# Patient Record
Sex: Male | Born: 1940 | Race: White | Hispanic: No | Smoking: Former smoker
Health system: Southern US, Community
[De-identification: ages and names within clinical notes are randomized; demographics above are authoritative.]

## PROBLEM LIST (undated history)

## (undated) DIAGNOSIS — K5100 Ulcerative (chronic) pancolitis without complications: Secondary | ICD-10-CM

## (undated) DIAGNOSIS — E7800 Pure hypercholesterolemia, unspecified: Secondary | ICD-10-CM

## (undated) DIAGNOSIS — I480 Paroxysmal atrial fibrillation: Secondary | ICD-10-CM

## (undated) DIAGNOSIS — I2510 Atherosclerotic heart disease of native coronary artery without angina pectoris: Secondary | ICD-10-CM

## (undated) DIAGNOSIS — M1990 Unspecified osteoarthritis, unspecified site: Secondary | ICD-10-CM

## (undated) DIAGNOSIS — I10 Essential (primary) hypertension: Secondary | ICD-10-CM

## (undated) DIAGNOSIS — K219 Gastro-esophageal reflux disease without esophagitis: Secondary | ICD-10-CM

## (undated) HISTORY — PX: CARDIAC CATHETERIZATION: SHX172

## (undated) HISTORY — DX: Pure hypercholesterolemia, unspecified: E78.00

## (undated) HISTORY — DX: Unspecified osteoarthritis, unspecified site: M19.90

## (undated) HISTORY — PX: ROTATOR CUFF REPAIR: SHX139

## (undated) HISTORY — DX: Ulcerative (chronic) pancolitis without complications: K51.00

## (undated) HISTORY — DX: Atherosclerotic heart disease of native coronary artery without angina pectoris: I25.10

## (undated) HISTORY — PX: OTHER SURGICAL HISTORY: SHX169

## (undated) HISTORY — DX: Paroxysmal atrial fibrillation: I48.0

## (undated) HISTORY — DX: Essential (primary) hypertension: I10

## (undated) HISTORY — DX: Gastro-esophageal reflux disease without esophagitis: K21.9

---

## 1998-06-04 ENCOUNTER — Emergency Department (HOSPITAL_COMMUNITY): Admission: EM | Admit: 1998-06-04 | Discharge: 1998-06-04 | Payer: Self-pay | Admitting: Emergency Medicine

## 1998-08-31 ENCOUNTER — Ambulatory Visit (HOSPITAL_COMMUNITY): Admission: RE | Admit: 1998-08-31 | Discharge: 1998-08-31 | Payer: Self-pay | Admitting: Gastroenterology

## 2000-09-12 ENCOUNTER — Encounter (INDEPENDENT_AMBULATORY_CARE_PROVIDER_SITE_OTHER): Payer: Self-pay | Admitting: Specialist

## 2000-09-12 ENCOUNTER — Ambulatory Visit (HOSPITAL_COMMUNITY): Admission: RE | Admit: 2000-09-12 | Discharge: 2000-09-12 | Payer: Self-pay | Admitting: Gastroenterology

## 2002-08-07 ENCOUNTER — Emergency Department (HOSPITAL_COMMUNITY): Admission: EM | Admit: 2002-08-07 | Discharge: 2002-08-07 | Payer: Self-pay | Admitting: Emergency Medicine

## 2003-03-05 ENCOUNTER — Ambulatory Visit (HOSPITAL_COMMUNITY): Admission: RE | Admit: 2003-03-05 | Discharge: 2003-03-05 | Payer: Self-pay | Admitting: Internal Medicine

## 2003-03-05 ENCOUNTER — Encounter: Payer: Self-pay | Admitting: Internal Medicine

## 2003-11-11 ENCOUNTER — Ambulatory Visit (HOSPITAL_COMMUNITY): Admission: RE | Admit: 2003-11-11 | Discharge: 2003-11-11 | Payer: Self-pay | Admitting: Gastroenterology

## 2003-11-11 ENCOUNTER — Encounter (INDEPENDENT_AMBULATORY_CARE_PROVIDER_SITE_OTHER): Payer: Self-pay | Admitting: Specialist

## 2004-11-09 ENCOUNTER — Ambulatory Visit (HOSPITAL_COMMUNITY): Admission: RE | Admit: 2004-11-09 | Discharge: 2004-11-10 | Payer: Self-pay | Admitting: Cardiology

## 2005-08-17 ENCOUNTER — Encounter: Admission: RE | Admit: 2005-08-17 | Discharge: 2005-08-17 | Payer: Self-pay | Admitting: Gastroenterology

## 2005-08-31 ENCOUNTER — Encounter (INDEPENDENT_AMBULATORY_CARE_PROVIDER_SITE_OTHER): Payer: Self-pay | Admitting: Specialist

## 2005-08-31 ENCOUNTER — Ambulatory Visit (HOSPITAL_COMMUNITY): Admission: RE | Admit: 2005-08-31 | Discharge: 2005-08-31 | Payer: Self-pay | Admitting: General Surgery

## 2005-12-11 ENCOUNTER — Encounter: Admission: RE | Admit: 2005-12-11 | Discharge: 2005-12-11 | Payer: Self-pay | Admitting: Gastroenterology

## 2006-01-11 ENCOUNTER — Encounter: Admission: RE | Admit: 2006-01-11 | Discharge: 2006-01-11 | Payer: Self-pay | Admitting: Gastroenterology

## 2006-03-13 ENCOUNTER — Encounter: Admission: RE | Admit: 2006-03-13 | Discharge: 2006-03-13 | Payer: Self-pay | Admitting: Gastroenterology

## 2006-10-28 ENCOUNTER — Observation Stay (HOSPITAL_COMMUNITY): Admission: EM | Admit: 2006-10-28 | Discharge: 2006-10-29 | Payer: Self-pay | Admitting: Emergency Medicine

## 2006-10-28 ENCOUNTER — Ambulatory Visit: Payer: Self-pay | Admitting: Cardiology

## 2006-10-28 ENCOUNTER — Encounter: Payer: Self-pay | Admitting: Cardiology

## 2007-04-18 IMAGING — CT CT ABDOMEN W/ CM
2 of 5 series · 15 of 42 positions shown, 19 images · IV contrast (READICAT/WATER & [ID] OMNI 300)
Comparison: No prior CT.

CLINICAL DATA: Mid abdominal pain and distention. History of ulcerative
colitis.

CT ABDOMEN AND PELVIS WITH CONTRAST 03/13/2006:
TECHNIQUE: Multidetector helical CT of the abdomen and pelvis was performed
during bolus administration of intravenous contrast. Oral contrast was given.
Delayed imaging through the kidneys was performed.
Contrast:  125 cc Omnipaque 300.

[Series 102: a&p w/ · axial · 0.66mm/px · z∈[-400,-39]mm · 12 of 414 slices shown, 16 images]
[im 35/414  soft-tissue]
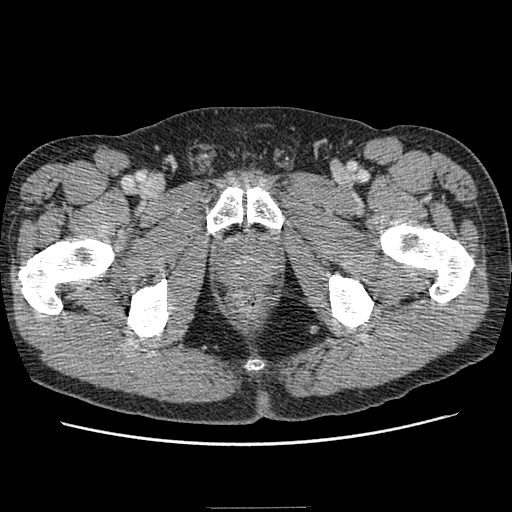
[im 35/414  bone]
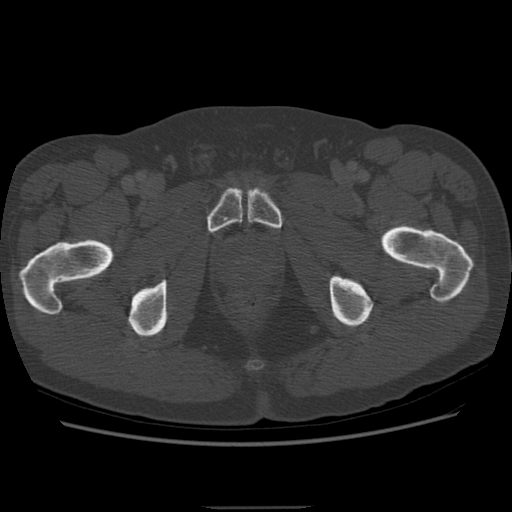
[im 69/414  soft-tissue]
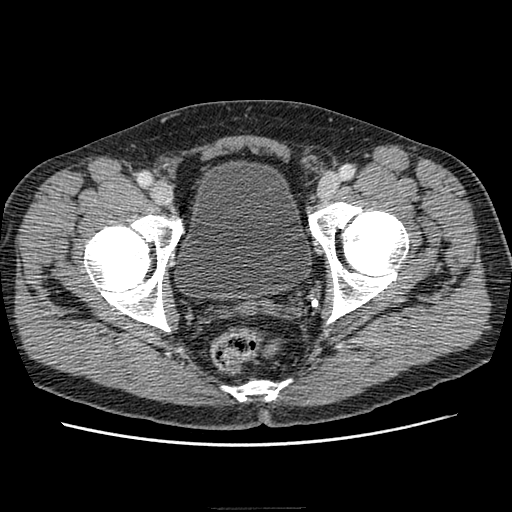
[im 104/414  soft-tissue]
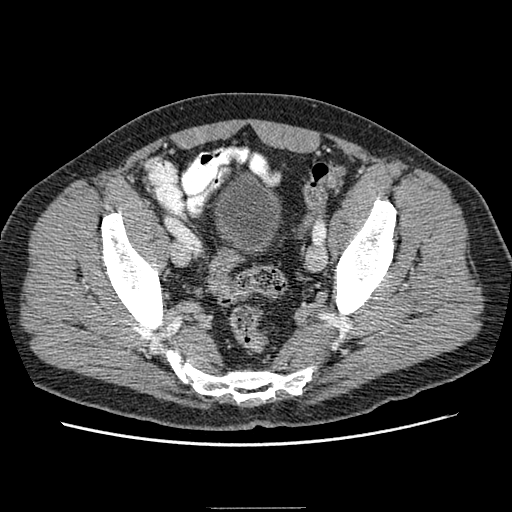
[im 155/414  soft-tissue]
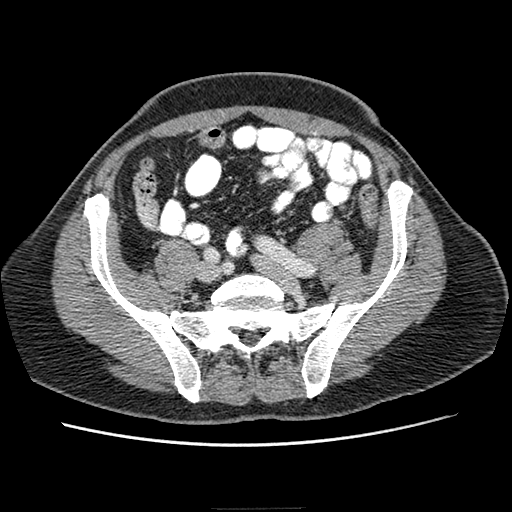
[im 190/414  soft-tissue]
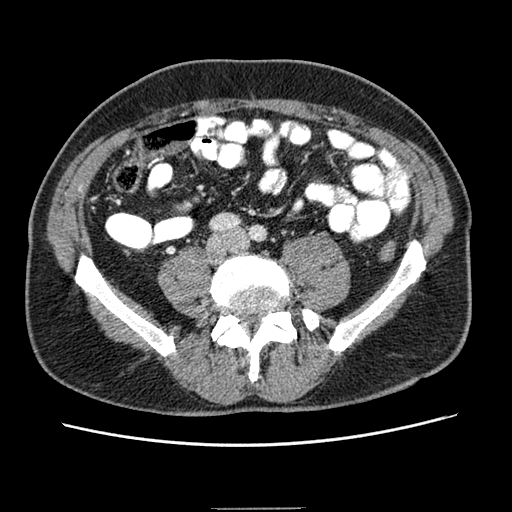
[im 224/414  soft-tissue]
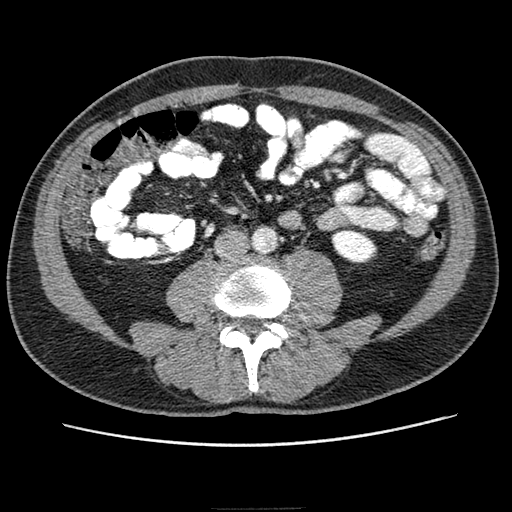
[im 259/414  soft-tissue]
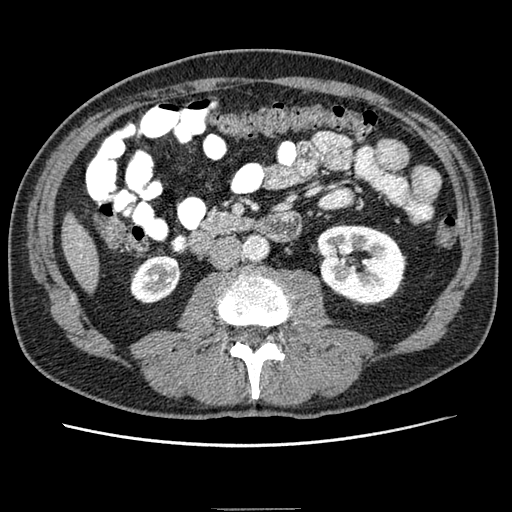
[im 310/414  soft-tissue]
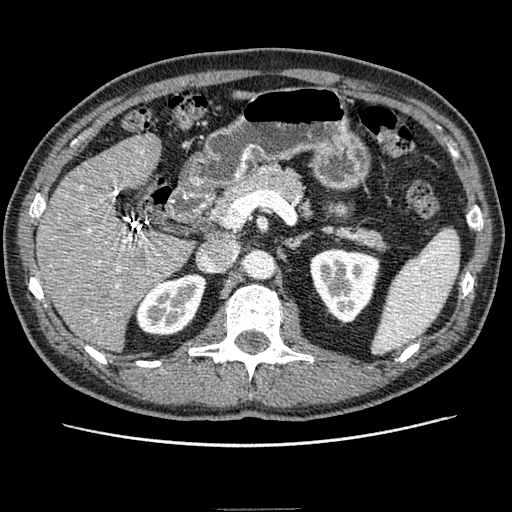
[im 345/414  soft-tissue]
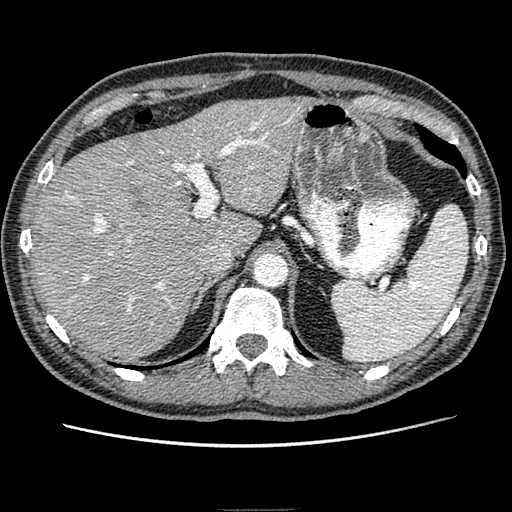
[im 345/414  lung]
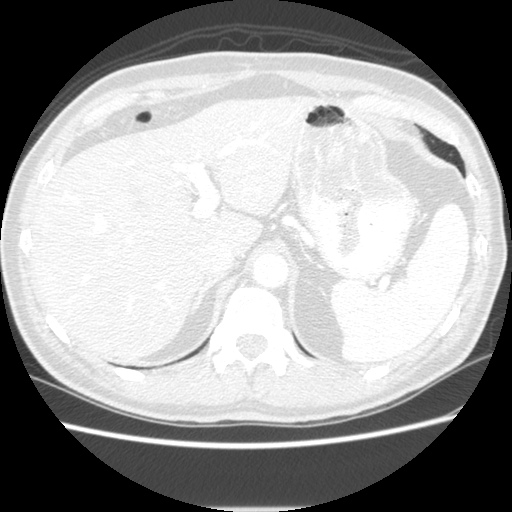
[im 345/414  bone]
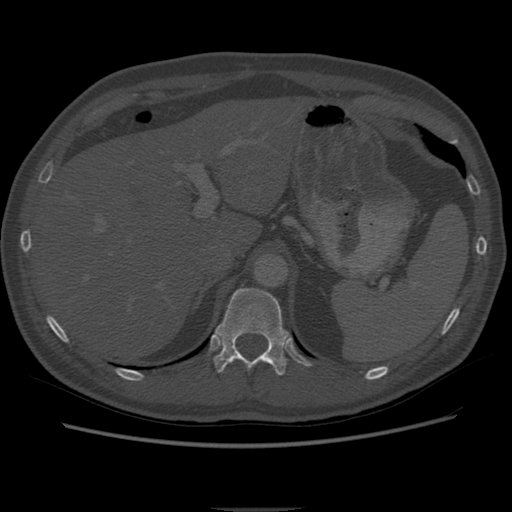
[im 362/414  lung]
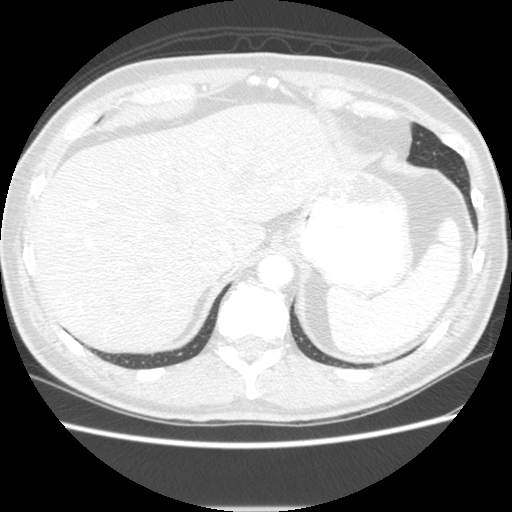
[im 379/414  soft-tissue]
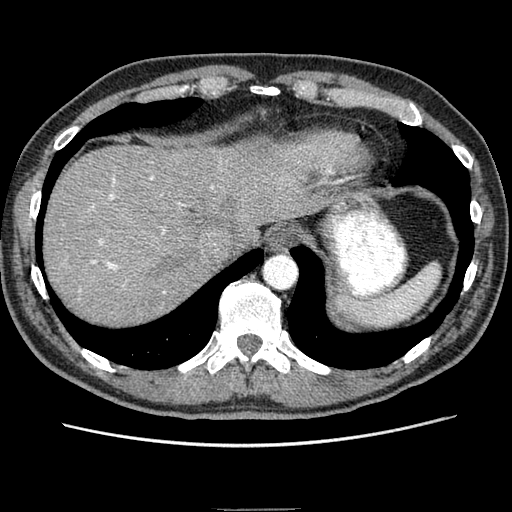
[im 379/414  lung]
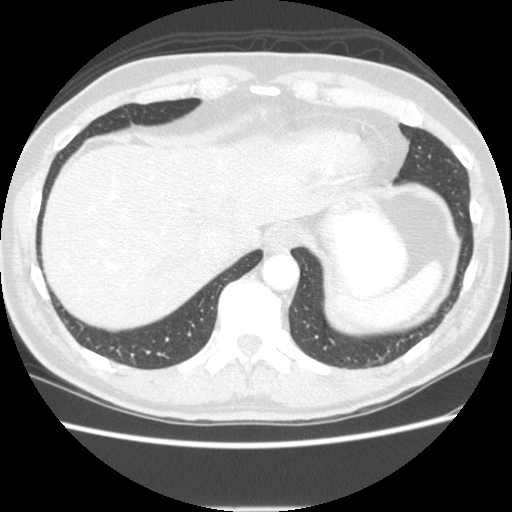
[im 396/414  lung]
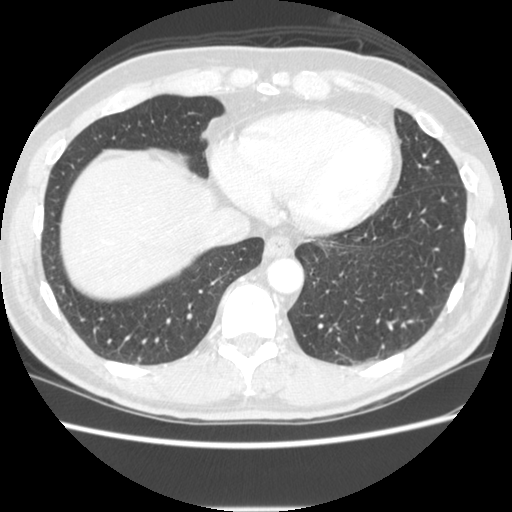

[Series 103: reformatted · sagittal · 0.84mm/px · 3 of 132 slices shown]
[im 27/132  soft-tissue]
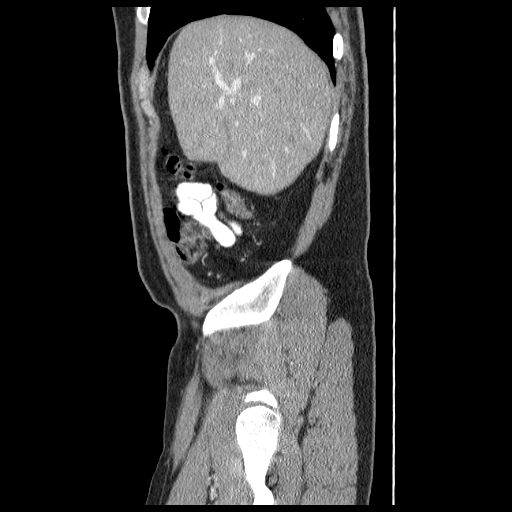
[im 53/132  soft-tissue]
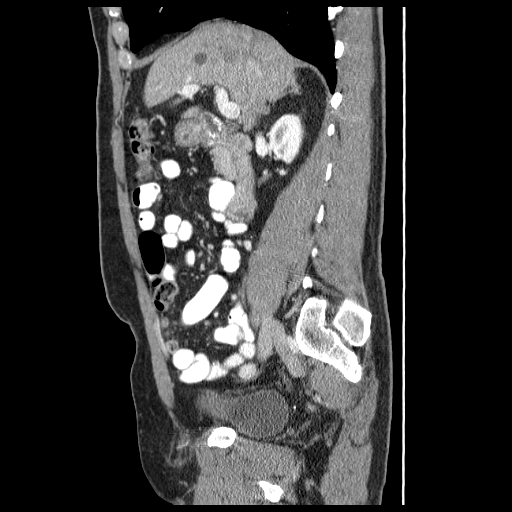
[im 79/132  soft-tissue]
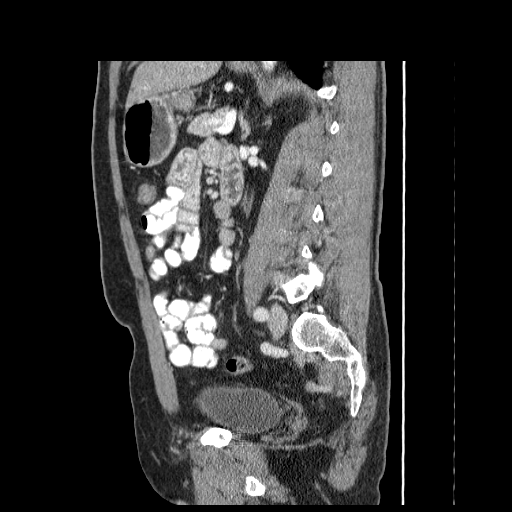

[15 of 42 positions shown; findings below may reference images not displayed]

Abdominal ultrasound 08/17/2005 and intraoperative
cholangiogram 08/31/2005 are correlated.

CT ABDOMEN:

Mild diffuse fatty infiltration of the liver is present; no focal hepatic
abnormalities are identified. The spleen and pancreas are normal. The
gallbladder is surgically absent. There is no biliary ductal dilation. Mild
atrophy is present involving the right kidney; the left kidney is normal. Both
adrenal glands are normal. The stomach and small bowel are unremarkable. There
is edema involving the wall of the ascending colon, with minimal pericolonic
inflammation/edema. The remainder of the visualized colon in the upper abdomen
is unremarkable. There is no ascites. There is no significant lymphadenopathy.
The visualized lung bases are clear.
IMPRESSION: 1. Edema involving the wall of the ascending colon with associated pericolonic
inflammation. This finding is consistent with ascending colitis.
2. No acute abnormalities otherwise in the abdomen.
3. Mild right renal atrophy.

CT PELVIS:

The descending and sigmoid colon and rectum are unremarkable, and there is no
evidence of inflammation. The prostate gland is mildly enlarged and contains
calcifications. The urinary bladder is normal. The small bowel is unremarkable.
IMPRESSION: 1. No acute abnormalities in the pelvis.
2. Mild prostate gland enlargement.

## 2008-09-05 ENCOUNTER — Emergency Department (HOSPITAL_COMMUNITY): Admission: EM | Admit: 2008-09-05 | Discharge: 2008-09-06 | Payer: Self-pay | Admitting: Emergency Medicine

## 2009-12-15 ENCOUNTER — Ambulatory Visit (HOSPITAL_COMMUNITY): Admission: RE | Admit: 2009-12-15 | Discharge: 2009-12-15 | Payer: Self-pay | Admitting: Gastroenterology

## 2011-02-16 NOTE — Discharge Summary (Signed)
Willie Gonzalez, Willie Gonzalez NO.:  [id number]   MEDICAL RECORD NO.:  [id number]          PATIENT TYPE:  OBV   LOCATION:  6531                         FACILITY:  [facility]   PHYSICIAN:  Francisca December, M.D.  DATE OF BIRTH:  1940-10-14   DATE OF ADMISSION:  10/27/2006  DATE OF DISCHARGE:  10/29/2006                               DISCHARGE SUMMARY   ADMISSION DIAGNOSIS:  New onset atrial fibrillation   DISCHARGE DIAGNOSES:  1. New onset atrial fibrillation status post spontaneous conversion to      normal sinus rhythm.  2. Dyslipidemia.  3. GERD.  4. Allergy to ASPIRIN.  5. Coronary artery disease status post mid LAD and first diagonal      stent 06/2005.  Stable without angina.  6. Status post appendectomy.  7. Hypokalemia, repleted.  8. Hypertension.   CONSULTANT:  None.   PROCEDURES:  None during this admission.   HOSPITAL COURSE:  Willie Gonzalez is a 70 year old male with a known  history of coronary artery disease status post mid LAD and first  diagonal stent implantation in September 2006.  He was admitted to the  [facility] on 10/28/2006 with new onset atrial fibrillation  with RVR.  While in route to the [facility] ED, he was given IV  diltiazem with a decrease in his heart rate.  Upon arrival to the [facility], a 12-lead EKG was performed and revealed atrial  fibrillation with a ventricular rate of 104 beats per minute.  There was  no evidence of acute ST-segment/T-wave changes.  A 2-D echocardiogram  revealed normal LV systolic function with an EF of 60%.  This study was  inadequate for evaluation of regional wall motion abnormalities.  TSH  was within normal limits.  BNP was within normal limits.  Serial cardiac  enzymes were negative x2 with a peak troponin of 0.02.  The patient was  started on IV heparin as well as given IV diltiazem 15 mg bolus with a 5-  15 mg per hour drip that was titrated to achieve a heart rate between 60  and  100 beats per minute, keeping his systolic blood pressure greater  than 100 beats per minute.  During this admission, the patient was  hypokalemic however was repleted with K-Dur.  On 10/28/2006, the patient  spontaneously converted to normal sinus rhythm and maintained that  rhythm throughout the remainder of the admission.  He was started on  metoprolol 25 mg twice daily after discontinuing the IV diltiazem drip.  His spontaneous conversion to normal sinus rhythm was confirmed with a  12-lead EKG.  He was discharged home in stable condition on 10/29/2006  without complaints of palpitations, chest pain, dizziness, shortness of  breath, near syncope, or syncope.  He was seen and examined by Dr.  Amil Amen prior to discharge.   LABORATORY DATA:  Serial cardiac enzymes CK total 76 x2, CK-MB 0.8 and  0.9 respectively.  Troponin I 0.01 and 0.02, myoglobin 67.6, CK-MB less  than 1.0.  Troponin I less than 0.05.  BNP less than 30.0.  TSH 3.080.  PT 14.3, INR 1.1, PTT 32, heparin level 0.46.  Sodium was 141, potassium  3.0, chloride 105, CO2 27, glucose 123, BUN 9, creatinine 0.98, total  bilirubin 2.3, alkaline phosphatase 41, AST 19, ALT 20, total protein  6.5, albumin, serum 4.1, calcium 9.5, magnesium 2.2.  White blood count  5.6, hemoglobin 14.8, hematocrit 42.7, platelets 176,000.   X-RAYS:  Chest x-ray 10/28/2006 revealed no evidence of acute  cardiopulmonary disease.  EKG: 12-lead EKG 10/28/2006 revealed normal  sinus rhythm with a ventricular rate of 64 beats per minute.  There was  no evidence of ischemic changes.   CONDITION ON DISCHARGE:  Stable.   DISCHARGE MEDICATIONS:  1. Potassium chloride 20 mEq twice daily for 3 days, then once daily      thereafter.  This represented a new prescription, a prescription      was given with refills.  2. Simvastatin 40 mg daily.  3. Plavix 75 mg every other day.  4. HCTZ 25 mg daily.  5. Metoprolol 25 mg twice daily.  This represented a new  prescription,      a prescription was given with refills.  6. Protonix 40 mg daily.   DISCHARGE INSTRUCTIONS:  1. Continue a low-sodium, low-fat, and low cholesterol diet.  2. Stop any activity that causes chest pain, shortness of breath,      dizziness, sweating, or excessive weakness.  3. Delay colonoscopy for 1 month.  4. BMET on 11/02/2006.  5. May return to work on 10/30/2006 without restrictions   FOLLOW-UP ARRANGEMENTS:  The patient was asked to call [facility] cardiology  to schedule an appointment with Dr. Corliss Marcus for 3-4 weeks.  He did  go back to the discharge instructions.      [name], [place]      Francisca December, M.D.  Electronically Signed    RDM/MEDQ  D:  12/31/2006  T:  12/31/2006  Job:  [id number]

## 2011-02-16 NOTE — H&P (Signed)
NAME:  Willie, Gonzalez NO.:  [id number]   MEDICAL RECORD NO.:  [id number]          PATIENT TYPE:  EMS   LOCATION:  MAJO                         FACILITY:  [facility]   PHYSICIAN:  Ulyses Amor, MD DATE OF BIRTH:  May 25, 1941   DATE OF ADMISSION:  10/27/2006  DATE OF DISCHARGE:                              HISTORY & PHYSICAL   Willie Gonzalez is a 70 year old white man who is admitted to [facility] for the further management of new-onset atrial fibrillation  with a rapid ventricular rate.   The patient has a history of coronary artery disease, having undergone  stenting of the mid LAD and first diagonal in September 2006.  He has no  history of myocardial infarction.  He has not experienced any further  chest pain since his percutaneous coronary intervention.  He has no  history of congestive heart failure, nor does he have a history of  cardiac arrhythmia.   The patient was watching television this evening when he experienced the  sudden onset of a rapid and irregular heart beat.  There was no  associated dyspnea, diaphoresis, or nausea, nor was there any dizziness,  lightheadedness, syncope, or near syncope.  He reported no chest pain,  tightness, heaviness, pressure, or squeezing.  He presented to the  emergency department where he was found to be in atrial fibrillation  with a moderate ventricular rate.  He had received a single dose of  intravenous diltiazem en route.  Since being in the emergency room, his  rhythm has remained atrial fibrillation with a ventricular rate in the  70s and 80s.  He is otherwise asymptomatic at this time.   The patient has a number of risk factors for coronary artery disease  including hypertension and dyslipidemia.  There is no history of  diabetes mellitus, smoking, or family history of early coronary artery  disease.   The patient's only other medical problem is that of gastroesophageal  reflux.   PAST SURGICAL  HISTORY:  Appendectomy.   SOCIAL HISTORY:  The patient lives with his wife.  He is a retired  Systems developer.  He does not smoke cigarettes.  He does not drink  alcohol.   FAMILY HISTORY:  His mother is 102 and alive and well.  His father died  of emphysema.   MEDICATIONS:  1. Simvastatin 40 mg p.o. daily.  2. Plavix 75 mg p.o. daily.  3. Hydrochlorothiazide 25 mg p.o. daily.  4. Protonix 20 mg p.o. daily.   ALLERGIES:  ASPIRIN.   REVIEW OF SYSTEMS:  No new problems related to his head, eyes, ears,  nose, mouth, throat, lungs, gastrointestinal system, genitourinary  system, or extremities.  There is no history of neurologic or  psychiatric disorder.  There is no history of fever, chills, or weight  loss.   PHYSICAL EXAMINATION:  VITAL SIGNS:  Blood pressure 110/69, pulse 78 and  regular, respirations 18, temperature 98.6.  GENERAL:  The patient was a middle-aged white man in no discomfort.  He  was alert, oriented, appropriate, and responsive.  HEENT:  Normal.  NECK:  Without thyromegaly or adenopathy.  Carotid pulses were palpable  bilaterally and without bruits.  CARDIAC:  Irregularly irregular rhythm.  No gallop, murmur, rub, or  click.  CHEST:  No chest wall tenderness was noted.  LUNGS:  Clear.  ABDOMEN:  Soft, nontender.  There was no mass, hepatosplenomegaly,  bruit, distention, rebound, guarding, or rigidity.  Bowel sounds were  normal.  RECTAL/GENITAL:  Not performed as they were not pertinent to the reason  for acute care hospitalization.  EXTREMITIES:  Without edema, deviation, or deformity.  Radial and  dorsalis pedis pulses were palpable bilaterally.  NEUROLOGIC:  Brief  screening neurological survey was unremarkable.   The electrocardiogram revealed atrial fibrillation with a ventricular  rate of 104 beats per minute.  There were no repolarization  abnormalities.  The chest radiograph, according to the radiologist,  demonstrated no evidence of  cardiopulmonary disease.  The initial set of  cardiac markers revealed a myoglobin of 67.6, CK-MB less than 1.0, and  troponin less than 0.06.  The potassium was 3.3, BUN 10, creatinine 1.1.  The remaining studies were pending at the time of this dictation.   IMPRESSION:  1. New-onset atrial fibrillation, with a rapid ventricular rate.  2. Coronary artery disease, status post mid left anterior descending      and first diagonal stent in September 2006.  3. Hypertension.  4. Dyslipidemia.  5. Gastroesophageal reflux.   PLAN:  1. Telemetry.  2. Serial cardiac enzymes.  3. No aspirin due to allergy.  4. Intravenous heparin.  5. Intravenous diltiazem as needed for rate control.  6. Thyroid function tests.  7. Echocardiogram.  8. Further measures per Dr. Amil Amen.      Ulyses Amor, MD  Electronically Signed     MSC/MEDQ  D:  10/28/2006  T:  10/28/2006  Job:  [id number]   cc:   Francisca December, M.D.

## 2011-02-16 NOTE — Op Note (Signed)
NAMEMONTOYA, BRANDEL              ACCOUNT NO.:  [id number]   MEDICAL RECORD NO.:  [id number]          PATIENT TYPE:  AMB   LOCATION:  DAY                          FACILITY:  [facility]   PHYSICIAN:  Ollen Gross. Vernell Morgans, M.D. DATE OF BIRTH:  01-30-1941   DATE OF PROCEDURE:  08/31/2005  DATE OF DISCHARGE:                                 OPERATIVE REPORT   PREOPERATIVE DIAGNOSIS:  Gallstones.   POSTOPERATIVE DIAGNOSIS:  Gallstones.   PROCEDURE:  Laparoscopic cholecystectomy with intraoperative cholangiogram.   SURGEON:  Ollen Gross. Carolynne Edouard, M.D.   ANESTHESIA:  General endotracheal.   DESCRIPTION OF PROCEDURE:  After informed consent was obtained, the patient  was brought to the operating room, placed in the supine position on the  operating table. After adequate induction of general anesthesia, the  patient's abdomen was prepped with Betadine, draped in the usual sterile  manner. The area below the umbilicus was infiltrated with a 0.25% Marcaine.  A small incision was made with a 15 blade knife. This incision was carried  down through the subcutaneous tissue bluntly with a hemostat and [employer]  retractors until the linea alba was identified. The linea alba was incised  with a 15 blade knife and each side was grasped with Kocher clamps and  elevated anteriorly. The preperitoneal space was probed bluntly with a  hemostat until the peritoneum was opened and access was gained to the  abdominal cavity. A #0 Vicryl pursestring stitch was placed on the fascia  surrounding the opening, Hasson cannula was placed through the opening and  anchored in place with the previously placed Vicryl pursestring stitch. The  abdomen was then insufflated with carbon dioxide without difficulty. The  patient was placed in the head-up position, rotated slightly with the right  side up. A laparoscope was inserted through the Hasson cannula and the right  upper quadrant was inspected. The dome of gallbladder and liver  were readily  identified. Next the epigastric region was infiltrated with 0.25% Marcaine.  A small incision was made with a  15 blade knife and a 10 mm port was placed  bluntly through this incision into the abdominal cavity under direct vision.  Sites were then chosen lateral on the right side of the abdomen for  replacement of 5 mm ports. Each of these areas was infiltrated with 0.25%  Marcaine. Small stab incisions were made with a 15 blade knife and 5 mm  ports were placed bluntly through these incisions into the abdominal cavity  under direct vision. A blunt grasper was placed through the lateral most 5  mm port and used to grasp the dome of the gallbladder and elevate it  anteriorly and superiorly. Another blunt grasper was placed through the  other 5 mm port and used to retract on the body and neck of the gallbladder  so omental adhesions were taken down with the dissector sharply using the  electrocautery. Some other filmy adhesions to the gallbladder body were  taken down sharply with the laparoscopic scissors. Once this was  accomplished, the dissector was used with the electrocautery to open the  peritoneal reflection at the gallbladder neck. Blunt dissection then carried  in this area until the gallbladder neck cystic duct junction was readily  identified and a good window was created. A single clip was placed on the  gallbladder neck, a small ductotomy was made just below the clip. A 14 gauge  Angiocath was placed percutaneously through the anterior abdominal wall  under direct vision. The Reddick cholangiogram catheter was placed through  the Angiocath and flushed. The Reddick catheter was then placed within the  cystic duct and anchored in place with a clip. A cholangiogram was obtained  that showed no filling defects, good emptying into the duodenum and long  length on the cystic duct that emptied near the distal end of the common  duct. At this point, the anchoring  catheters were removed from the patient,  three clips were placed proximally on the cystic duct and the duct was  divided between the two sets of clips. Posterior to this, the cystic artery  was identified and again dissected bluntly in a circumferential manner until  a good window was created. Two clips were placed proximally and one distally  on the artery and the artery was divided between the two. Next a  laparoscopic hook cautery device was used to separate the gallbladder from  the liver bed. Prior to completely detaching the gallbladder from liver bed,  the liver bed was inspected. Several small bleeding points were coagulated  with the electrocautery until the area was completely hemostatic. The  gallbladder was then detached the rest of the way from liver bed with the  hook electrocautery. A laparoscopic bag was placed through the epigastric  port, the gallbladder was placed within the bag and the bag was sealed. The  abdomen was then irrigated with copious amounts of saline until the effluent  was clear. The laparoscope was then moved to the epigastric port and the  gallbladder grasper was placed through the Hasson cannula and used to grasp  the opening of the bag and the bag with the gallbladder was then removed  through the infraumbilical port without difficulty with the Hasson cannula.  The fascial defect was closed with the previously placed Vicryl pursestring  stitch as well as with another interrupted figure-of-eight #0 Vicryl stitch.  The rest of the ports were then removed under direct vision and all were  found to be hemostatic. The gas was allowed to escape. The fascia of the  epigastric port was closed with an interrupted #0 Vicryl stitch. The skin  incisions were all closed with interrupted 4-0 Monocryl subcuticular  stitches, Benzoin and Steri-Strips were applied. The patient tolerated the  procedure well. At the end of the case, all needle, sponge and  instrument counts were correct. The patient was then awakened and taken to the recovery  room in stable condition.      Ollen Gross. Vernell Morgans, M.D.  Electronically Signed     PST/MEDQ  D:  08/31/2005  T:  08/31/2005  Job:  [id number]

## 2011-02-16 NOTE — Op Note (Signed)
NAME:  Willie Gonzalez, Willie Gonzalez                        ACCOUNT NO.:  [id number]   MEDICAL RECORD NO.:  [id number]                   PATIENT TYPE:  AMB   LOCATION:  ENDO                                 FACILITY:  [facility]   PHYSICIAN:  Danise Edge, M.D.                DATE OF BIRTH:  1941/02/23   DATE OF PROCEDURE:  11/11/2003  DATE OF DISCHARGE:                                 OPERATIVE REPORT   PROCEDURE:  Colonoscopy with colonic biopsy.   PROCEDURE INDICATION:  Mr. Raymond Bhardwaj is 70 years old, born June 04, 1941.  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  September 12, 2000, proctocolonoscopy to the cecum was normal.  Colonic  biopsies were negative for dysplasia.  Mr. Urey remains in remission  symptomatically.  He is due for repeat colonoscopy with colonic biopsies to  rule out dysplasia.   ENDOSCOPIST:  Danise Edge, M.D.   PREMEDICATION:  Versed 10 mg, Demerol 100 mg, atropine 0.5 mg.   DESCRIPTION OF PROCEDURE:  After obtaining informed consent, Mr. Salois  was placed in the left lateral decubitus position.  I administered  intravenous Demerol and intravenous Versed to achieve conscious sedation for  the procedure.  The patient's blood pressure, oxygen saturation, and cardiac  rhythm were monitored throughout the procedure and documented in the medical  record.   Anal inspection was normal.  Digital rectal exam revealed a non-nodular  prostate.  The Olympus adjustable pediatric colonoscope was introduced into  the rectum and with some difficulty due to colonic loop formation eventually  advanced to the cecum.  With colonic stretching, Mr. Klees developed  severe bradycardia, which resolved after the administration of 0.5 mg of  atropine.  Colonic preparation for the exam today was excellent.   Rectum normal.   Sigmoid colon and descending colon normal.   Splenic flexure normal.   Transverse colon normal.   Hepatic flexure  normal.   Ascending colon normal.   Cecum and ileocecal valve normal.   Four-quadrant biopsies were taken every 10 cm starting in the cecum.  Eight  biopsies were submitted in each bottle.  Four bottles were submitted for  pathologic evaluation.   ASSESSMENT:  1. Normal proctocolonoscopy to the cecum.  2. Severe bradycardia due to colonic stretching, relieved with atropine.     With the next colonoscopy I would give Mr. Coury 0.5 mg of atropine     prior to starting the procedure, administered with his conscious     sedation.                                               Danise Edge, M.D.    MJ/MEDQ  D:  11/11/2003  T:  11/11/2003  Job:  [id number]

## 2011-02-16 NOTE — Procedures (Signed)
[facility]  Patient:    Willie Gonzalez, Willie Gonzalez                    MRN: [id number] Proc. Date: 09/12/00 Attending:  Verlin Grills, M.D.                           Procedure Report  PROCEDURE:  Colonoscopy.  ENDOSCOPIST: : Verlin Grills, M.D.  INDICATIONS:  Willie Gonzalez (date of birth June 05, 1946) is a 70 year old male with chronic inactive ulcerative colitis.  Surveillance colonoscopy with random colonic biopsies to rule out colonic dysplasia is scheduled today.  I discussed with Willie Gonzalez the complications associated with colonoscopy including intestinal bleeding and intestinal perforation. Willie Gonzalez has signed the operative permit.  PREMEDICATION:  Versed 12 mg, Demerol 100 mg.  ENDOSCOPE:  Olympus pediatric colonoscope.  DESCRIPTION OF PROCEDURE:  After obtaining the informed consent, the patient was placed in the left lateral decubitus position.  I administered intravenous Demerol and intravenous Versed to achieve conscious sedation before the procedure.  The patients blood pressure, oxygen saturation, and cardiac rhythm were monitored throughout the procedure and documented in the medical record.  Anal inspection was normal.  Digital rectal exam revealed a non-nodular prostate.  The Olympus pediatric video colonoscope was introduced into the rectum and under direct vision advanced to the cecum as identified by a normal-appearing appendiceal orifice and ileocecal valve.  Colonic preparation for the exam today was excellent.  Rectum normal.  Sigmoid colon and descending colon:  Normal.  Splenic flexure normal.  Transverse colon normal.  Hepatic flexure normal.  Ascending colon normal.  Cecum and ileocecal valve normal.  Approximately 32 random surveillance colon biopsies were taken throughout the rectum and colon.  Eight biopsies were taken from the right colon, eight biopsies were taken from the  transverse colon, eight biopsies were taken from the left colon and eight biopsies taken from the rectosigmoid colon.  Biopsies were submitted to rule out dysplasia.  ASSESSMENT: 1. Normal proctocolonoscopy to the cecum. 2. Chronic inactive colitis. 3. Surveillance biopsies to rule out dysplasia pending. DD:  09/12/00 TD:  09/12/00 Job: [id number]

## 2011-06-29 ENCOUNTER — Emergency Department (HOSPITAL_COMMUNITY)
Admission: EM | Admit: 2011-06-29 | Discharge: 2011-06-29 | Disposition: A | Discharge status: Home / Self Care | Payer: Federal, State, Local not specified - PPO | Attending: Emergency Medicine | Admitting: Emergency Medicine

## 2011-06-29 ENCOUNTER — Emergency Department (HOSPITAL_COMMUNITY): Payer: Federal, State, Local not specified - PPO

## 2011-06-29 DIAGNOSIS — Y9355 Activity, bike riding: Secondary | ICD-10-CM | POA: Insufficient documentation

## 2011-06-29 DIAGNOSIS — E7800 Pure hypercholesterolemia, unspecified: Secondary | ICD-10-CM | POA: Insufficient documentation

## 2011-06-29 DIAGNOSIS — S0990XA Unspecified injury of head, initial encounter: Secondary | ICD-10-CM | POA: Insufficient documentation

## 2011-06-29 DIAGNOSIS — M25529 Pain in unspecified elbow: Secondary | ICD-10-CM | POA: Insufficient documentation

## 2011-06-29 DIAGNOSIS — I2510 Atherosclerotic heart disease of native coronary artery without angina pectoris: Secondary | ICD-10-CM | POA: Insufficient documentation

## 2011-06-29 DIAGNOSIS — IMO0002 Reserved for concepts with insufficient information to code with codable children: Secondary | ICD-10-CM | POA: Insufficient documentation

## 2011-06-29 DIAGNOSIS — M25519 Pain in unspecified shoulder: Secondary | ICD-10-CM | POA: Insufficient documentation

## 2011-06-29 DIAGNOSIS — M79609 Pain in unspecified limb: Secondary | ICD-10-CM | POA: Insufficient documentation

## 2011-07-06 LAB — CBC
HCT: 43.4 % (ref 39.0–52.0)
Hemoglobin: 14.9 g/dL (ref 13.0–17.0)
MCHC: 34.3 g/dL (ref 30.0–36.0)
MCV: 89.3 fL (ref 78.0–100.0)
Platelets: 210 10*3/uL (ref 150–400)
RBC: 4.86 MIL/uL (ref 4.22–5.81)
RDW: 12.4 % (ref 11.5–15.5)
WBC: 6.7 10*3/uL (ref 4.0–10.5)

## 2011-07-06 LAB — COMPREHENSIVE METABOLIC PANEL
ALT: 21 U/L (ref 0–53)
AST: 20 U/L (ref 0–37)
Albumin: 4.2 g/dL (ref 3.5–5.2)
Alkaline Phosphatase: 46 U/L (ref 39–117)
BUN: 11 mg/dL (ref 6–23)
CO2: 26 mEq/L (ref 19–32)
Calcium: 9.2 mg/dL (ref 8.4–10.5)
Chloride: 101 mEq/L (ref 96–112)
Creatinine, Ser: 1.01 mg/dL (ref 0.4–1.5)
GFR calc Af Amer: >60 mL/min (ref >60)
GFR calc non Af Amer: >60 mL/min (ref >60)
Glucose, Bld: 112 mg/dL — ABNORMAL HIGH (ref 70–99)
Potassium: 3.6 mEq/L (ref 3.5–5.1)
Sodium: 134 mEq/L — ABNORMAL LOW (ref 135–145)
Total Bilirubin: 1.6 mg/dL — ABNORMAL HIGH (ref 0.3–1.2)
Total Protein: 7.2 g/dL (ref 6.0–8.3)

## 2011-07-06 LAB — POCT CARDIAC MARKERS
CKMB, poc: 1 ng/mL (ref 1.0–8.0)
CKMB, poc: <1 ng/mL — ABNORMAL LOW (ref 1.0–8.0)
Myoglobin, poc: 78.2 ng/mL (ref 12–200)
Myoglobin, poc: 89.4 ng/mL (ref 12–200)
Troponin i, poc: <0.05 ng/mL (ref 0.00–0.09)
Troponin i, poc: <0.05 ng/mL (ref 0.00–0.09)

## 2011-07-06 LAB — DIFFERENTIAL
Basophils Absolute: 0 10*3/uL (ref 0.0–0.1)
Basophils Relative: 1 % (ref 0–1)
Eosinophils Absolute: 0.1 10*3/uL (ref 0.0–0.7)
Eosinophils Relative: 2 % (ref 0–5)
Lymphocytes Relative: 30 % (ref 12–46)
Lymphs Abs: 2 10*3/uL (ref 0.7–4.0)
Monocytes Absolute: 0.4 10*3/uL (ref 0.1–1.0)
Monocytes Relative: 6 % (ref 3–12)
Neutro Abs: 4.1 10*3/uL (ref 1.7–7.7)
Neutrophils Relative %: 61 % (ref 43–77)

## 2011-07-06 LAB — POCT I-STAT, CHEM 8
BUN: 13 mg/dL (ref 6–23)
Calcium, Ion: 1.06 mmol/L — ABNORMAL LOW (ref 1.12–1.32)
Chloride: 105 mEq/L (ref 96–112)
Creatinine, Ser: 1.1 mg/dL (ref 0.4–1.5)
Glucose, Bld: 114 mg/dL — ABNORMAL HIGH (ref 70–99)
HCT: 42 % (ref 39.0–52.0)
Hemoglobin: 14.3 g/dL (ref 13.0–17.0)
Potassium: 3.9 mEq/L (ref 3.5–5.1)
Sodium: 138 mEq/L (ref 135–145)
TCO2: 26 mmol/L (ref 0–100)

## 2011-07-06 LAB — PROTIME-INR
INR: 2.3 — ABNORMAL HIGH (ref 0.00–1.49)
Prothrombin Time: 26.3 seconds — ABNORMAL HIGH (ref 11.6–15.2)

## 2011-07-06 LAB — LIPASE, BLOOD: Lipase: 26 U/L (ref 11–59)

## 2012-02-01 ENCOUNTER — Other Ambulatory Visit: Payer: Self-pay | Admitting: Gastroenterology

## 2013-09-05 ENCOUNTER — Encounter: Payer: Self-pay | Admitting: *Deleted

## 2013-09-05 ENCOUNTER — Encounter: Payer: Self-pay | Admitting: Cardiology

## 2013-09-07 ENCOUNTER — Encounter (INDEPENDENT_AMBULATORY_CARE_PROVIDER_SITE_OTHER): Payer: Self-pay

## 2013-09-07 ENCOUNTER — Ambulatory Visit (INDEPENDENT_AMBULATORY_CARE_PROVIDER_SITE_OTHER): Payer: Federal, State, Local not specified - PPO | Admitting: Cardiology

## 2013-09-07 ENCOUNTER — Encounter: Payer: Self-pay | Admitting: Cardiology

## 2013-09-07 VITALS — BP 134/80 | HR 67 | Ht 68.0 in | Wt 163.0 lb

## 2013-09-07 DIAGNOSIS — I4891 Unspecified atrial fibrillation: Secondary | ICD-10-CM

## 2013-09-07 DIAGNOSIS — I480 Paroxysmal atrial fibrillation: Secondary | ICD-10-CM

## 2013-09-07 HISTORY — DX: Paroxysmal atrial fibrillation: I48.0

## 2013-09-07 MED ORDER — METOPROLOL TARTRATE 25 MG PO TABS: 25.0000 mg | ORAL_TABLET | Freq: Two times a day (BID) | ORAL | Status: DC

## 2013-09-07 NOTE — Progress Notes (Signed)
[address] [place], [place]  [address] Phone: [phone] Fax:  [phone]  Date:  09/07/2013   ID:  Willie, Gonzalez May 20, 1941, MRN [id number]  PCP:  Charolett Bumpers, MD   History of Present Illness: Willie Gonzalez is a 72 y.o. male with mid LAD/diagonal coronary artery disease in 2006 with drug eluting stent, last stress test in 2009 showing normal perfusion, 10 minutes of exercise, paroxysmal atrial fibrillation post ablation 2009 with no recurrence, GERD, on Plavix (had prior aspirin intolerance but no allergy) with hyperlipidemia.  He rides his bike without any significant difficulty. He has seen Dr. Laural Benes. He is pleased with his symptoms. He has not had anypalpitations or any chest discomfort. He is no longer riding his bicycle on road  First bout of afib for over 5 years which lasted for 18 hours. Had a heavy exercise routine the day before. Denies any chest pain   Wt Readings from Last 3 Encounters:  09/07/13 163 lb (73.936 kg)     Past Medical History  Diagnosis Date  . Hypercholesteremia   . Universal ulcerative (chronic) colitis   . Coronary atherosclerosis of unspecified type of vessel, native or graft   . HTN (hypertension)   . GERD (gastroesophageal reflux disease)   . DJD (degenerative joint disease)     Past Surgical History  Procedure Laterality Date  . Lap choley      Current Outpatient Prescriptions  Medication Sig Dispense Refill  . clopidogrel (PLAVIX) 75 MG tablet Take 75 mg by mouth daily with breakfast.      . FLUZONE HIGH-DOSE injection Inject 0.5 mLs into the muscle once.       . hydrochlorothiazide (HYDRODIURIL) 25 MG tablet Take 25 mg by mouth daily.      . lansoprazole (PREVACID) 30 MG capsule Take 30 mg by mouth as needed.       Marland Kitchen lisinopril (PRINIVIL,ZESTRIL) 10 MG tablet Take 10 mg by mouth daily. Every other day      . Multiple Vitamin (MULTIVITAMIN) capsule Take 1 capsule by mouth daily.      .  potassium chloride (KLOR-CON) 20 MEQ packet Take 20 mEq by mouth 2 (two) times daily.      . simvastatin (ZOCOR) 40 MG tablet Take 40 mg by mouth daily. Every other day       No current facility-administered medications for this visit.    Allergies:    Allergies  Allergen Reactions  . Aspirin     Stomach issues    Social History:  The patient  reports that he quit smoking about 56 years ago. He does not have any smokeless tobacco history on file.   ROS:  Please see the history of present illness.   No syncope, no bleeding, no orthopnea, no PND    PHYSICAL EXAM: VS:  BP 134/80  Pulse 67  Ht 5\' 8"  (1.727 m)  Wt 163 lb (73.936 kg)  BMI 24.79 kg/m2 Well nourished, well developed, in no acute distress HEENT: normal Neck: no JVD Cardiac:  normal S1, S2; RRR; no murmur Lungs:  clear to auscultation bilaterally, no wheezing, rhonchi or rales Abd: soft, nontender, no hepatomegaly Ext: no edema Skin: warm and dry Neuro: no focal abnormalities noted  EKG:  Sinus rhythm, no abnormalities    ASSESSMENT AND PLAN:  1. Paroxysmal atrial fibrillation-had an episode recently approximately 18 hours. Felt similar to his previous atrial fibrillation episodes. He is not currently  take beta blocker or calcium channel blocker and therefore I gave him metoprolol tartrate 25 mg twice a day when necessary to take just in case he is having an episode. Dr. Sampson Goon at [facility] performed ablation approximately 5 years ago. We discussed the implications of the short episode. This does not necessarily mean that he will have more. He did have a heavy exercise routine the day before. Perhaps this was an inflammatory response. If we note that he is having more episodes, we will need to revisit anticoagulation etc.   Signed, Donato Schultz, MD [facility]  09/07/2013 2:56 PM

## 2013-09-07 NOTE — Patient Instructions (Signed)
Your physician has recommended you make the following change in your medication:   1. Metoprolol Tartrate 25 mg twice daily as needed.  Your physician wants you to follow-up in: 6 months with Dr. Caro Hight. You will receive a reminder letter in the mail two months in advance. If you don't receive a letter, please call our office to schedule the follow-up appointment.

## 2013-10-20 ENCOUNTER — Other Ambulatory Visit: Payer: Self-pay | Admitting: Cardiology

## 2013-11-03 ENCOUNTER — Other Ambulatory Visit: Payer: Self-pay | Admitting: Cardiology

## 2013-11-04 NOTE — Telephone Encounter (Signed)
Can you verify with eagle chart? Should this be daily or every other day? Please advise. Thanks, MI

## 2013-11-09 NOTE — Telephone Encounter (Signed)
It is prescribed as one time daily

## 2013-11-09 NOTE — Telephone Encounter (Signed)
Its once daily

## 2014-01-12 ENCOUNTER — Other Ambulatory Visit: Payer: Self-pay | Admitting: *Deleted

## 2014-01-12 MED ORDER — HYDROCHLOROTHIAZIDE 25 MG PO TABS: 25.0000 mg | ORAL_TABLET | Freq: Every day | ORAL | Status: DC

## 2014-02-22 ENCOUNTER — Other Ambulatory Visit: Payer: Self-pay | Admitting: *Deleted

## 2014-02-22 DIAGNOSIS — E7800 Pure hypercholesterolemia, unspecified: Secondary | ICD-10-CM

## 2014-02-22 DIAGNOSIS — Z79899 Other long term (current) drug therapy: Secondary | ICD-10-CM

## 2014-03-03 ENCOUNTER — Other Ambulatory Visit (INDEPENDENT_AMBULATORY_CARE_PROVIDER_SITE_OTHER): Payer: Federal, State, Local not specified - PPO

## 2014-03-03 DIAGNOSIS — E7800 Pure hypercholesterolemia, unspecified: Secondary | ICD-10-CM

## 2014-03-03 DIAGNOSIS — Z79899 Other long term (current) drug therapy: Secondary | ICD-10-CM

## 2014-03-03 LAB — HEPATIC FUNCTION PANEL
ALT: 19 U/L (ref 0–53)
AST: 20 U/L (ref 0–37)
Albumin: 4.3 g/dL (ref 3.5–5.2)
Alkaline Phosphatase: 39 U/L (ref 39–117)
Bilirubin, Direct: 0.2 mg/dL (ref 0.0–0.3)
Total Bilirubin: 2.1 mg/dL — ABNORMAL HIGH (ref 0.2–1.2)
Total Protein: 6.9 g/dL (ref 6.0–8.3)

## 2014-03-03 LAB — LIPID PANEL
Cholesterol: 130 mg/dL (ref 0–200)
HDL: 41.9 mg/dL (ref >39.00)
LDL Cholesterol: 70 mg/dL (ref 0–99)
NonHDL: 88.1
Total CHOL/HDL Ratio: 3
Triglycerides: 91 mg/dL (ref 0.0–149.0)
VLDL: 18.2 mg/dL (ref 0.0–40.0)

## 2014-03-04 NOTE — Progress Notes (Signed)
Quick Note:  Patient notified of lab results. Patient verbalized understanding and appreciation for phone call. Results routed to PCP. ______

## 2014-03-08 ENCOUNTER — Other Ambulatory Visit: Payer: Self-pay | Admitting: Cardiology

## 2014-03-18 ENCOUNTER — Telehealth: Payer: Self-pay | Admitting: *Deleted

## 2014-03-18 ENCOUNTER — Other Ambulatory Visit: Payer: Self-pay | Admitting: *Deleted

## 2014-03-18 MED ORDER — METOPROLOL TARTRATE 25 MG PO TABS: 25.0000 mg | ORAL_TABLET | Freq: Two times a day (BID) | ORAL | Status: DC

## 2014-03-18 MED ORDER — POTASSIUM CHLORIDE CRYS ER 20 MEQ PO TBCR: 20.0000 meq | EXTENDED_RELEASE_TABLET | Freq: Every day | ORAL | Status: DC

## 2014-03-18 NOTE — Telephone Encounter (Signed)
Can you clarify the frequency of potassium for this patient? The chart has packet bid, but the pharmacy says it is qd. Please advise. Thanks, MI

## 2014-03-26 ENCOUNTER — Encounter: Payer: Self-pay | Admitting: Cardiology

## 2014-04-12 ENCOUNTER — Encounter: Payer: Self-pay | Admitting: Cardiology

## 2014-04-13 ENCOUNTER — Other Ambulatory Visit: Payer: Self-pay

## 2014-04-13 MED ORDER — HYDROCHLOROTHIAZIDE 25 MG PO TABS: 25.0000 mg | ORAL_TABLET | Freq: Every day | ORAL | Status: DC

## 2014-04-27 ENCOUNTER — Ambulatory Visit: Payer: Federal, State, Local not specified - PPO | Admitting: Cardiology

## 2014-05-12 ENCOUNTER — Other Ambulatory Visit: Payer: Self-pay

## 2014-05-12 MED ORDER — CLOPIDOGREL BISULFATE 75 MG PO TABS: ORAL_TABLET | ORAL | Status: DC

## 2014-05-19 ENCOUNTER — Ambulatory Visit: Payer: Federal, State, Local not specified - PPO | Admitting: Cardiology

## 2014-05-23 ENCOUNTER — Other Ambulatory Visit: Payer: Self-pay | Admitting: Cardiology

## 2014-05-28 ENCOUNTER — Ambulatory Visit: Payer: Federal, State, Local not specified - PPO | Admitting: Cardiology

## 2014-06-01 ENCOUNTER — Ambulatory Visit (INDEPENDENT_AMBULATORY_CARE_PROVIDER_SITE_OTHER): Payer: Federal, State, Local not specified - PPO | Admitting: Cardiology

## 2014-06-01 ENCOUNTER — Encounter: Payer: Self-pay | Admitting: Cardiology

## 2014-06-01 VITALS — BP 124/86 | HR 70 | Ht 68.0 in | Wt 163.0 lb

## 2014-06-01 DIAGNOSIS — I480 Paroxysmal atrial fibrillation: Secondary | ICD-10-CM

## 2014-06-01 DIAGNOSIS — Z79899 Other long term (current) drug therapy: Secondary | ICD-10-CM

## 2014-06-01 DIAGNOSIS — E785 Hyperlipidemia, unspecified: Secondary | ICD-10-CM

## 2014-06-01 DIAGNOSIS — I4891 Unspecified atrial fibrillation: Secondary | ICD-10-CM

## 2014-06-01 MED ORDER — POTASSIUM CHLORIDE CRYS ER 20 MEQ PO TBCR: EXTENDED_RELEASE_TABLET | ORAL | Status: DC

## 2014-06-01 NOTE — Patient Instructions (Signed)
The current medical regimen is effective;  continue present plan and medications.  Follow up in 1 year with Dr. Anne Fu and fasting lab work (lipid and Alt).  You will receive a letter in the mail 2 months before you are due.  Please call us when you receive this letter to schedule your follow up appointment.

## 2014-06-01 NOTE — Progress Notes (Signed)
[address] [place], [place]  [address] Phone: [phone] Fax:  [phone]  Date:  06/01/2014   ID:  Willie Gonzalez, Willie Gonzalez January 16, 1941, MRN [id number]  PCP:  Charolett Bumpers, MD   History of Present Illness: Willie Gonzalez is a 73 y.o. male with mid LAD/diagonal coronary artery disease in 2006 with drug eluting stent, last stress test in 2009 showing normal perfusion, 10 minutes of exercise, paroxysmal atrial fibrillation post ablation 2009 with no recurrence, GERD, on Plavix (had prior aspirin intolerance but no allergy) with hyperlipidemia.  He rides his bike without any significant difficulty. He has seen Dr. Laural Benes. He is pleased with his symptoms. He has not had anypalpitations or any chest discomfort. He is no longer riding his bicycle on road  First bout of afib for over 5 years which lasted for 18 hours. Had a heavy exercise routine the day before. Denies any chest pain   Wt Readings from Last 3 Encounters:  06/01/14 163 lb (73.936 kg)  09/07/13 163 lb (73.936 kg)     Past Medical History  Diagnosis Date  . Hypercholesteremia   . Universal ulcerative (chronic) colitis(556.6)   . Coronary atherosclerosis of unspecified type of vessel, native or graft   . HTN (hypertension)   . GERD (gastroesophageal reflux disease)   . DJD (degenerative joint disease)   . Paroxysmal atrial fibrillation 09/07/2013    Ablation in 2009 at [facility], Dr. Sampson Gonzalez     Past Surgical History  Procedure Laterality Date  . Lap choley      Current Outpatient Prescriptions  Medication Sig Dispense Refill  . clopidogrel (PLAVIX) 75 MG tablet TAKE 1 TABLET BY MOUTH DAILY  30 tablet  3  . FLUZONE HIGH-DOSE injection Inject 0.5 mLs into the muscle once.       . hydrochlorothiazide (HYDRODIURIL) 25 MG tablet Take 1 tablet (25 mg total) by mouth daily.  30 tablet  1  . KLOR-CON M20 20 MEQ tablet TAKE 1 TABLET BY MOUTH EVERY DAY  30 tablet  0  . lansoprazole (PREVACID) 30  MG capsule Take 30 mg by mouth as needed.       Marland Kitchen lisinopril (PRINIVIL,ZESTRIL) 10 MG tablet TAKE ONE TABLET BY MOUTH DAILY  30 tablet  5  . Multiple Vitamin (MULTIVITAMIN) capsule Take 1 capsule by mouth daily.      . simvastatin (ZOCOR) 40 MG tablet TAKE 1 TABLET BY MOUTH EVERY OTHER DAY  45 tablet  3   No current facility-administered medications for this visit.    Allergies:    Allergies  Allergen Reactions  . Aspirin     Stomach issues    Social History:  The patient  reports that he quit smoking about 56 years ago. He does not have any smokeless tobacco history on file.   ROS:  Please see the history of present illness.   No syncope, no bleeding, no orthopnea, no PND    PHYSICAL EXAM: VS:  BP 124/86  Pulse 70  Ht  (1.727 m)  Wt 163 lb (73.936 kg)  BMI 24.79 kg/m2 Well nourished, well developed, in no acute distress HEENT: normal Neck: no JVD Cardiac:  normal S1, S2; RRR; no murmur Lungs:  clear to auscultation bilaterally, no wheezing, rhonchi or rales Abd: soft, nontender, no hepatomegaly Ext: no edema Skin: warm and dry Neuro: no focal abnormalities noted  EKG:  06/01/14 -[address] rhythm,70, no abnormalities    ASSESSMENT AND  PLAN:  1. Paroxysmal atrial fibrillation-has not had an episode recently. Last one approximately 18 hours. Felt similar to his previous atrial fibrillation episodes. He is not currently take beta blocker or calcium channel blocker and therefore I gave him metoprolol tartrate 25 mg twice a day when necessary to take just in case he is having an episode. Dr. Sampson Gonzalez at [facility] performed ablation approximately 5 years ago. We discussed the implications of the short episode. This does not necessarily mean that he will have more. He did have a heavy exercise routine the day before. Perhaps this was an inflammatory response. If we note that he is having more episodes, we will need to revisit anticoagulation etc.  2. Hyperlipidemia-simvastatin. Doing  well. 3. Year follow up.  Signed, Donato Schultz, MD [facility]  06/01/2014 10:50 AM

## 2014-06-19 ENCOUNTER — Other Ambulatory Visit: Payer: Self-pay | Admitting: Cardiology

## 2014-09-11 ENCOUNTER — Other Ambulatory Visit: Payer: Self-pay | Admitting: Cardiology

## 2014-09-14 ENCOUNTER — Other Ambulatory Visit: Payer: Self-pay | Admitting: Cardiology

## 2014-10-13 ENCOUNTER — Other Ambulatory Visit: Payer: Self-pay

## 2014-10-13 MED ORDER — HYDROCHLOROTHIAZIDE 25 MG PO TABS: 25.0000 mg | ORAL_TABLET | Freq: Every day | ORAL | Status: DC

## 2014-10-26 ENCOUNTER — Other Ambulatory Visit: Payer: Self-pay | Admitting: Cardiology

## 2015-01-10 ENCOUNTER — Other Ambulatory Visit: Payer: Self-pay | Admitting: Cardiology

## 2015-04-20 ENCOUNTER — Other Ambulatory Visit: Payer: Self-pay

## 2015-04-20 ENCOUNTER — Other Ambulatory Visit: Payer: Self-pay | Admitting: Cardiology

## 2015-04-20 MED ORDER — HYDROCHLOROTHIAZIDE 25 MG PO TABS: 25.0000 mg | ORAL_TABLET | Freq: Every day | ORAL | Status: DC

## 2015-04-25 ENCOUNTER — Other Ambulatory Visit: Payer: Self-pay | Admitting: Cardiology

## 2015-04-25 ENCOUNTER — Other Ambulatory Visit: Payer: Self-pay

## 2015-04-25 MED ORDER — SIMVASTATIN 40 MG PO TABS: 40.0000 mg | ORAL_TABLET | ORAL | Status: DC

## 2015-05-16 ENCOUNTER — Other Ambulatory Visit: Payer: Federal, State, Local not specified - PPO

## 2015-06-01 ENCOUNTER — Ambulatory Visit (INDEPENDENT_AMBULATORY_CARE_PROVIDER_SITE_OTHER): Payer: Federal, State, Local not specified - PPO | Admitting: Cardiology

## 2015-06-01 ENCOUNTER — Encounter: Payer: Self-pay | Admitting: Cardiology

## 2015-06-01 VITALS — BP 126/80 | HR 74 | Ht 68.0 in | Wt 162.0 lb

## 2015-06-01 DIAGNOSIS — I480 Paroxysmal atrial fibrillation: Secondary | ICD-10-CM

## 2015-06-01 NOTE — Patient Instructions (Signed)
Medication Instructions:  Your physician recommends that you continue on your current medications as directed. Please refer to the Current Medication list given to you today.  Follow-Up: Follow up in 1 year with Dr. Skains.  You will receive a letter in the mail 2 months before you are due.  Please call us when you receive this letter to schedule your follow up appointment.  Thank you for choosing [facility]!!       

## 2015-06-01 NOTE — Progress Notes (Signed)
[address] [place], [place]  [address] Phone: [phone] Fax:  [phone]  Date:  06/01/2015   ID:  Wildon, Cuevas [birth year], MRN [id number]  PCP:  Charolett Bumpers, MD   History of Present Illness: Willie Gonzalez is a 74 y.o. male with mid LAD/diagonal coronary artery disease in 2006 with drug eluting stent, last stress test in 2009 showing normal perfusion, 10 minutes of exercise, paroxysmal atrial fibrillation post ablation 2009 with no recurrence, GERD, on Plavix (had prior aspirin intolerance but no allergy) with hyperlipidemia.  He rides his bike without any significant difficulty. He has seen Dr. Laural Benes. He is pleased with his symptoms. He has not had anypalpitations or any chest discomfort. He is no longer riding his bicycle on [address].  First bout of afib for over 5 years which lasted for 18 hours. Had a heavy exercise routine the day before. Denies any chest pain.  Doing very well,   Wt Readings from Last 3 Encounters:  06/01/15 162 lb (73.483 kg)  06/01/14 163 lb (73.936 kg)  09/07/13 163 lb (73.936 kg)     Past Medical History  Diagnosis Date  . Hypercholesteremia   . Universal ulcerative (chronic) colitis(556.6)   . Coronary atherosclerosis of unspecified type of vessel, native or graft   . HTN (hypertension)   . GERD (gastroesophageal reflux disease)   . DJD (degenerative joint disease)   . Paroxysmal atrial fibrillation 09/07/2013    Ablation in 2009 at [facility], Dr. Sampson Goon     Past Surgical History  Procedure Laterality Date  . Lap choley      Current Outpatient Prescriptions  Medication Sig Dispense Refill  . clopidogrel (PLAVIX) 75 MG tablet TAKE 1 TABLET BY MOUTH EVERY DAY 30 tablet 6  . FLUZONE HIGH-DOSE injection Inject 0.5 mLs into the muscle once.     . hydrochlorothiazide (HYDRODIURIL) 25 MG tablet Take 1 tablet (25 mg total) by mouth daily. 30 tablet 2  . lansoprazole (PREVACID) 15 MG capsule Take  15 mg by mouth daily at 12 noon.    . Multiple Vitamin (MULTIVITAMIN) capsule Take 1 capsule by mouth daily.    . potassium chloride SA (KLOR-CON M20) 20 MEQ tablet TAKE 1 TABLET BY MOUTH EVERY DAY 90 tablet 3  . simvastatin (ZOCOR) 40 MG tablet Take 1 tablet (40 mg total) by mouth every other day. 45 tablet 1   No current facility-administered medications for this visit.    Allergies:    Allergies  Allergen Reactions  . Aspirin     Stomach issues    Social History:  The patient  reports that he quit smoking about 57 years ago. He does not have any smokeless tobacco history on file.   ROS:  Please see the history of present illness.   No syncope, no bleeding, no orthopnea, no PND    PHYSICAL EXAM: VS:  BP 126/80 mmHg  Pulse 74  Ht 5\' 8"  (1.727 m)  Wt 162 lb (73.483 kg)  BMI 24.64 kg/m2 Well nourished, well developed, in no acute distress HEENT: normal Neck: no JVD Cardiac:  normal S1, S2; RRR; no murmur Lungs:  clear to auscultation bilaterally, no wheezing, rhonchi or rales Abd: soft, nontender, no hepatomegaly Ext: no edema Skin: warm and dry Neuro: no focal abnormalities noted  EKG:  06/01/14 - Sinus rhythm,70, no abnormalities    ASSESSMENT AND PLAN:  1. Paroxysmal atrial fibrillation-has not had an episode recently. Last  one approximately 1 hours. Felt similar to his previous atrial fibrillation episodes. He is not currently take beta blocker or calcium channel blocker and therefore I gave him metoprolol tartrate 25 mg twice a day when necessary to take just in case he is having an episode. Dr. Sampson Goon at [facility] performed ablation approximately 6 years ago. We discussed the implications of the short episode. This does not necessarily mean that he will have more. He did have a heavy exercise routine the day before. Perhaps this was an inflammatory response. If we note that he is having more episodes, we will need to revisit anticoagulation etc.  2. Coronary artery  disease-continuing with Plavix-early generation stent. No bleeding side effects. 3. Hyperlipidemia-simvastatin. Doing well. LDL goal less than 70. LDL 78. 4. Year follow up.  Signed, Donato Schultz, MD [facility] [place]  06/01/2015 10:46 AM

## 2015-07-12 ENCOUNTER — Other Ambulatory Visit: Payer: Self-pay | Admitting: Cardiology

## 2015-07-22 ENCOUNTER — Other Ambulatory Visit: Payer: Self-pay | Admitting: Cardiology

## 2015-08-07 ENCOUNTER — Other Ambulatory Visit: Payer: Self-pay | Admitting: Cardiology

## 2015-08-08 ENCOUNTER — Other Ambulatory Visit: Payer: Self-pay | Admitting: *Deleted

## 2015-08-08 MED ORDER — CLOPIDOGREL BISULFATE 75 MG PO TABS: 75.0000 mg | ORAL_TABLET | Freq: Every day | ORAL | Status: DC

## 2015-11-11 ENCOUNTER — Other Ambulatory Visit: Payer: Self-pay | Admitting: Cardiology

## 2015-11-15 ENCOUNTER — Other Ambulatory Visit: Payer: Self-pay | Admitting: Cardiology

## 2015-12-21 ENCOUNTER — Other Ambulatory Visit: Payer: Self-pay | Admitting: *Deleted

## 2015-12-21 MED ORDER — CLOPIDOGREL BISULFATE 75 MG PO TABS: 75.0000 mg | ORAL_TABLET | Freq: Every day | ORAL | Status: DC

## 2016-03-12 ENCOUNTER — Telehealth: Payer: Self-pay | Admitting: Cardiology

## 2016-03-12 DIAGNOSIS — I10 Essential (primary) hypertension: Secondary | ICD-10-CM

## 2016-03-12 DIAGNOSIS — Z79899 Other long term (current) drug therapy: Secondary | ICD-10-CM

## 2016-03-12 DIAGNOSIS — E7800 Pure hypercholesterolemia, unspecified: Secondary | ICD-10-CM

## 2016-03-12 DIAGNOSIS — I480 Paroxysmal atrial fibrillation: Secondary | ICD-10-CM

## 2016-03-12 NOTE — Telephone Encounter (Signed)
Spoke with pt - he has not had labs drawn anywhere recently.  Needs a Lipid, ALT and BMP.  Labs ordered.

## 2016-03-12 NOTE — Telephone Encounter (Signed)
NeW Message  Pt scheduled ROV for 8/31 and lab for 8/17- no lab orders in Epic; Please advise.

## 2016-03-12 NOTE — Telephone Encounter (Signed)
Left message to c/b to discuss labs needed.

## 2016-03-14 ENCOUNTER — Other Ambulatory Visit: Payer: Self-pay | Admitting: Cardiology

## 2016-04-05 ENCOUNTER — Other Ambulatory Visit: Payer: Self-pay | Admitting: Specialist

## 2016-04-05 DIAGNOSIS — M25561 Pain in right knee: Secondary | ICD-10-CM

## 2016-05-17 ENCOUNTER — Encounter: Payer: Self-pay | Admitting: Cardiology

## 2016-05-17 ENCOUNTER — Other Ambulatory Visit: Payer: Federal, State, Local not specified - PPO

## 2016-05-26 ENCOUNTER — Other Ambulatory Visit: Payer: Self-pay | Admitting: Cardiology

## 2016-05-29 ENCOUNTER — Other Ambulatory Visit: Payer: Self-pay | Admitting: Gastroenterology

## 2016-05-31 ENCOUNTER — Encounter: Payer: Self-pay | Admitting: Cardiology

## 2016-05-31 ENCOUNTER — Ambulatory Visit (INDEPENDENT_AMBULATORY_CARE_PROVIDER_SITE_OTHER): Payer: Federal, State, Local not specified - PPO | Admitting: Cardiology

## 2016-05-31 VITALS — BP 138/88 | HR 70 | Ht 68.0 in | Wt 162.1 lb

## 2016-05-31 DIAGNOSIS — I10 Essential (primary) hypertension: Secondary | ICD-10-CM | POA: Diagnosis not present

## 2016-05-31 DIAGNOSIS — E7800 Pure hypercholesterolemia, unspecified: Secondary | ICD-10-CM

## 2016-05-31 DIAGNOSIS — I480 Paroxysmal atrial fibrillation: Secondary | ICD-10-CM

## 2016-05-31 NOTE — Patient Instructions (Signed)

## 2016-05-31 NOTE — Progress Notes (Signed)
[address] [place], [place]  [address] Phone: [phone] Fax:  [phone]  Date:  05/31/2016   ID:  Willie Gonzalez, DOB 01/07/1941, MRN [id number]  PCP:  Charolett BumpersJOHNSON,MARTIN K, MD   History of Present Illness: Willie Gonzalez is a 75 y.o. male with mid LAD/diagonal coronary artery disease in 2006 with drug eluting stent, last stress test in 2009 showing normal perfusion, 10 minutes of exercise, paroxysmal atrial fibrillation post ablation 2009 with no recurrence, GERD, on Plavix (had prior aspirin intolerance but no allergy) with hyperlipidemia.  He rides his bike without any significant difficulty. He has seen Dr. Laural BenesJohnson. He is pleased with his symptoms. He has not had anypalpitations or any chest discomfort. He is no longer riding his bicycle on [address].  First bout of afib for over 5 years which lasted for 18 hours. Had a heavy exercise routine the day before. Denies any chest pain.  Doing very well,  05/31/16 - few skips here and there. Too much caffiene will trigger. Early 9/17. Otherwise doing well. He did have some bouts of plantar fasciitis. Still riding his bicycle. Unfortunately, his wife died May 2017 from Alzheimer's.   Wt Readings from Last 3 Encounters:  05/31/16 162 lb 1.9 oz (73.5 kg)  06/01/15 162 lb (73.5 kg)  06/01/14 163 lb (73.9 kg)     Past Medical History:  Diagnosis Date  . Coronary atherosclerosis of unspecified type of vessel, native or graft   . DJD (degenerative joint disease)   . GERD (gastroesophageal reflux disease)   . HTN (hypertension)   . Hypercholesteremia   . Paroxysmal atrial fibrillation (HCC) 09/07/2013   [facility] Dr. Sampson GoonFitz[facility]   . Universal ulcerative (chronic) colitis     Past Surgical History:  Procedure Laterality Date  . lap choley      Current Outpatient Prescriptions  Medication Sig Dispense Refill  . clopidogrel (PLAVIX) 75 MG tablet Take 1 tablet (75 mg total) by mouth  daily. 90 tablet 1  . co-enzyme Q-10 30 MG capsule Take 30 mg by mouth daily.    Marland Kitchen. FLUZONE HIGH-DOSE injection Inject 0.5 mLs into the muscle once.     . hydrochlorothiazide (HYDRODIURIL) 25 MG tablet TAKE ONE TABLET BY MOUTH DAILY. 30 tablet 0  . KLOR-CON M20 20 MEQ tablet TAKE 1 TABLET BY MOUTH EVERY DAY 90 tablet 0  . lansoprazole (PREVACID) 15 MG capsule Take 15 mg by mouth daily at 12 noon.    . Magnesium 100 MG CAPS Take 100 mg by mouth every other day.    . Multiple Vitamin (MULTIVITAMIN) capsule Take 1 capsule by mouth daily.    . Probiotic Product (PROBIOTIC-10 PO) Take 1 tablet by mouth daily.    . simvastatin (ZOCOR) 40 MG tablet TAKE 1 TABLET (40 MG TOTAL) BY MOUTH EVERY OTHER DAY. 45 tablet 1   No current facility-administered medications for this visit.     Allergies:    Allergies  Allergen Reactions  . Aspirin     Stomach issues    Social History:  The patient  reports that he quit smoking about 58 years ago. He does not have any smokeless tobacco history on file.   ROS:  Please see the history of present illness.   No syncope, no bleeding, no orthopnea, no PND    PHYSICAL EXAM: VS:  BP 138/88   Pulse 70   Ht 5\' 8"  (1.727 m)  Wt 162 lb 1.9 oz (73.5 kg)   BMI 24.65 kg/m  Well nourished, well developed, in no acute distress  HEENT: normal  Neck: no JVD  Cardiac:  normal S1, S2; RRR; no murmur  Lungs:  clear to auscultation bilaterally, no wheezing, rhonchi or rales  Abd: soft, nontender, no hepatomegaly  Ext: no edema  Skin: warm and dry  Neuro: no focal abnormalities noted  EKG:  EKG was ordered today 05/31/16-sinus rhythm, 68, no other abnormality is. 06/01/14 - Sinus rhythm,70, no abnormalities    ASSESSMENT AND PLAN:  1. Paroxysmal atrial fibrillation-has not had an episode recently. Last one approximately 1 hours. Felt similar to his previous atrial fibrillation episodes. He is not currently take beta blocker or calcium channel blocker and therefore I  gave him metoprolol tartrate 25 mg twice a day when necessary to take just in case he is having an episode. Dr. Sampson Goon at [facility] performed ablation approximately 6 years ago. We discussed the implications of the short episode. This does not necessarily mean that he will have more. He did have a heavy exercise routine the day before. Perhaps this was an inflammatory response. If we note that he is having more episodes, we will need to revisit anticoagulation etc. rare palpitations as described above. We will continue to monitor. 2. Coronary artery disease-continuing with Plavix-early generation stent. No bleeding side effects. 3. Hyperlipidemia-simvastatin. Doing well. LDL goal less than 70. LDL 78. 4. Year follow up.  Signed, Donato Schultz, MD [facility]  05/31/2016 2:44 PM

## 2016-06-14 ENCOUNTER — Other Ambulatory Visit: Payer: Self-pay | Admitting: Cardiology

## 2016-06-26 ENCOUNTER — Encounter (HOSPITAL_COMMUNITY): Payer: Self-pay | Admitting: *Deleted

## 2016-06-28 ENCOUNTER — Other Ambulatory Visit: Payer: Self-pay | Admitting: Cardiology

## 2016-07-03 ENCOUNTER — Ambulatory Visit (HOSPITAL_COMMUNITY): Payer: Federal, State, Local not specified - PPO | Admitting: Anesthesiology

## 2016-07-03 ENCOUNTER — Encounter (HOSPITAL_COMMUNITY)
Admission: RE | Disposition: A | Discharge status: Home / Self Care | Payer: Self-pay | Source: Ambulatory Visit | Attending: Gastroenterology

## 2016-07-03 ENCOUNTER — Ambulatory Visit (HOSPITAL_COMMUNITY)
Admission: RE | Admit: 2016-07-03 | Discharge: 2016-07-03 | Disposition: A | Discharge status: Home / Self Care | Payer: Federal, State, Local not specified - PPO | Source: Ambulatory Visit | Attending: Gastroenterology | Admitting: Gastroenterology

## 2016-07-03 ENCOUNTER — Encounter (HOSPITAL_COMMUNITY): Payer: Self-pay | Admitting: *Deleted

## 2016-07-03 DIAGNOSIS — Z955 Presence of coronary angioplasty implant and graft: Secondary | ICD-10-CM | POA: Insufficient documentation

## 2016-07-03 DIAGNOSIS — K219 Gastro-esophageal reflux disease without esophagitis: Secondary | ICD-10-CM | POA: Diagnosis not present

## 2016-07-03 DIAGNOSIS — Z79899 Other long term (current) drug therapy: Secondary | ICD-10-CM | POA: Insufficient documentation

## 2016-07-03 DIAGNOSIS — Z7902 Long term (current) use of antithrombotics/antiplatelets: Secondary | ICD-10-CM | POA: Diagnosis not present

## 2016-07-03 DIAGNOSIS — Z8719 Personal history of other diseases of the digestive system: Secondary | ICD-10-CM | POA: Insufficient documentation

## 2016-07-03 DIAGNOSIS — E785 Hyperlipidemia, unspecified: Secondary | ICD-10-CM | POA: Insufficient documentation

## 2016-07-03 DIAGNOSIS — Z1211 Encounter for screening for malignant neoplasm of colon: Secondary | ICD-10-CM | POA: Diagnosis not present

## 2016-07-03 DIAGNOSIS — Z87891 Personal history of nicotine dependence: Secondary | ICD-10-CM | POA: Insufficient documentation

## 2016-07-03 DIAGNOSIS — I2510 Atherosclerotic heart disease of native coronary artery without angina pectoris: Secondary | ICD-10-CM | POA: Diagnosis not present

## 2016-07-03 DIAGNOSIS — I10 Essential (primary) hypertension: Secondary | ICD-10-CM | POA: Diagnosis not present

## 2016-07-03 DIAGNOSIS — I480 Paroxysmal atrial fibrillation: Secondary | ICD-10-CM | POA: Insufficient documentation

## 2016-07-03 HISTORY — PX: COLONOSCOPY WITH PROPOFOL: SHX5780

## 2016-07-03 SURGERY — COLONOSCOPY WITH PROPOFOL
Anesthesia: Monitor Anesthesia Care

## 2016-07-03 MED ORDER — PROPOFOL 10 MG/ML IV BOLUS
INTRAVENOUS | Status: AC
  Filled 2016-07-03: qty 40

## 2016-07-03 MED ORDER — LACTATED RINGERS IV SOLN
INTRAVENOUS | Status: DC
  Administered 2016-07-03: 1000 mL via INTRAVENOUS

## 2016-07-03 MED ORDER — SODIUM CHLORIDE 0.9 % IV SOLN: INTRAVENOUS | Status: DC

## 2016-07-03 MED ORDER — PHENYLEPHRINE 40 MCG/ML (10ML) SYRINGE FOR IV PUSH (FOR BLOOD PRESSURE SUPPORT)
PREFILLED_SYRINGE | INTRAVENOUS | Status: AC
  Filled 2016-07-03: qty 10

## 2016-07-03 MED ORDER — PHENYLEPHRINE HCL 10 MG/ML IJ SOLN
INTRAMUSCULAR | Status: DC | PRN
  Administered 2016-07-03 (×2): 80 ug via INTRAVENOUS

## 2016-07-03 MED ORDER — PROPOFOL 10 MG/ML IV BOLUS
INTRAVENOUS | Status: AC
  Filled 2016-07-03: qty 20

## 2016-07-03 MED ORDER — ONDANSETRON HCL 4 MG/2ML IJ SOLN: 4.0000 mg | Freq: Once | INTRAMUSCULAR | Status: DC | PRN

## 2016-07-03 MED ORDER — FENTANYL CITRATE (PF) 100 MCG/2ML IJ SOLN: 25.0000 ug | INTRAMUSCULAR | Status: DC | PRN

## 2016-07-03 MED ORDER — PROPOFOL 500 MG/50ML IV EMUL
INTRAVENOUS | Status: DC | PRN
  Administered 2016-07-03: 300 ug/kg/min via INTRAVENOUS

## 2016-07-03 SURGICAL SUPPLY — 21 items

## 2016-07-03 NOTE — Discharge Instructions (Signed)
YOU HAD AN ENDOSCOPIC PROCEDURE TODAY: Refer to the procedure report and other information in the discharge instructions given to you for any specific questions about what was found during the examination. If this information does not answer your questions, please call Dr. Laural BenesJohnson office at [phone] to clarify.   YOU SHOULD EXPECT: Some feelings of bloating in the abdomen. Passage of more gas than usual. Walking can help get rid of the air that was put into your GI tract during the procedure and reduce the bloating. If you had a lower endoscopy (such as a colonoscopy or flexible sigmoidoscopy) you may notice spotting of blood in your stool or on the toilet paper. Some abdominal soreness may be present for a day or two, also.  DIET: Your first meal following the procedure should be a light meal and then it is ok to progress to your normal diet. A half-sandwich or bowl of soup is an example of a good first meal. Heavy or fried foods are harder to digest and may make you feel nauseous or bloated. Drink plenty of fluids but you should avoid alcoholic beverages for 24 hours. If you had a esophageal dilation, please see attached instructions for diet.   ACTIVITY: Your care partner should take you home directly after the procedure. You should plan to take it easy, moving slowly for the rest of the day. You can resume normal activity the day after the procedure however YOU SHOULD NOT DRIVE, use power tools, machinery or perform tasks that involve climbing or major physical exertion for 24 hours (because of the sedation medicines used during the test).   SYMPTOMS TO REPORT IMMEDIATELY: A gastroenterologist can be reached at any hour. Please call [phone]  for any of the following symptoms:  Following lower endoscopy (colonoscopy, flexible sigmoidoscopy) Excessive amounts of blood in the stool  Significant tenderness, worsening of abdominal pains  Swelling of the abdomen that is new, acute  Fever of 100  or higher  Following upper endoscopy (EGD, EUS, ERCP, esophageal dilation) Vomiting of blood or coffee ground material  New, significant abdominal pain  New, significant chest pain or pain under the shoulder blades  Painful or persistently difficult swallowing  New shortness of breath  Black, tarry-looking or red, bloody stools  FOLLOW UP:  If any biopsies were taken you will be contacted by phone or by letter within the next 1-3 weeks. Call [phone]  if you have not heard about the biopsies in 3 weeks.  Please also call with any specific questions about appointments or follow up tests.  YOU HAD AN ENDOSCOPIC PROCEDURE TODAY: Refer to the procedure report and other information in the discharge instructions given to you for any specific questions about what was found during the examination. If this information does not answer your questions, please call Dr. Laural BenesJohnson office at [phone] to clarify.   YOU SHOULD EXPECT: Some feelings of bloating in the abdomen. Passage of more gas than usual. Walking can help get rid of the air that was put into your GI tract during the procedure and reduce the bloating. If you had a lower endoscopy (such as a colonoscopy or flexible sigmoidoscopy) you may notice spotting of blood in your stool or on the toilet paper. Some abdominal soreness may be present for a day or two, also.  DIET: Your first meal following the procedure should be a light meal and then it is ok to progress to your normal diet. A half-sandwich or bowl of soup is an  example of a good first meal. Heavy or fried foods are harder to digest and may make you feel nauseous or bloated. Drink plenty of fluids but you should avoid alcoholic beverages for 24 hours. If you had a esophageal dilation, please see attached instructions for diet.   ACTIVITY: Your care partner should take you home directly after the procedure. You should plan to take it easy, moving slowly for the rest of the day. You can  resume normal activity the day after the procedure however YOU SHOULD NOT DRIVE, use power tools, machinery or perform tasks that involve climbing or major physical exertion for 24 hours (because of the sedation medicines used during the test).   SYMPTOMS TO REPORT IMMEDIATELY: A gastroenterologist can be reached at any hour. Please call [phone]  for any of the following symptoms:  Following lower endoscopy (colonoscopy, flexible sigmoidoscopy) Excessive amounts of blood in the stool  Significant tenderness, worsening of abdominal pains  Swelling of the abdomen that is new, acute  Fever of 100 or higher  Following upper endoscopy (EGD, EUS, ERCP, esophageal dilation) Vomiting of blood or coffee ground material  New, significant abdominal pain  New, significant chest pain or pain under the shoulder blades  Painful or persistently difficult swallowing  New shortness of breath  Black, tarry-looking or red, bloody stools  FOLLOW UP:  If any biopsies were taken you will be contacted by phone or by letter within the next 1-3 weeks. Call [phone]  if you have not heard about the biopsies in 3 weeks.  Please also call with any specific questions about appointments or follow up tests.

## 2016-07-03 NOTE — Op Note (Signed)
[facility] Patient Name: Willie Gonzalez Procedure Date: 07/03/2016 MRN: [id number] Attending MD: Charolett Bumpers , MD Date of Birth: 1941-08-30 CSN: [id number] Age: 75 Admit Type: Outpatient Procedure:                Colonoscopy Indications:              Screening for colorectal malignant neoplasm,                            Incidental - Chronic ulcerative pancolitis Providers:                Charolett Bumpers, MD, Anthony Sar, RN, Kandice Robinsons, Technician Referring MD:              Medicines:                Propofol per Anesthesia Complications:            No immediate complications. Estimated Blood Loss:     Estimated blood loss: none. Procedure:                Pre-Anesthesia Assessment:                           - Prior to the procedure, a History and Physical                            was performed, and patient medications and                            allergies were reviewed. The patient's tolerance of                            previous anesthesia was also reviewed. The risks                            and benefits of the procedure and the sedation                            options and risks were discussed with the patient.                            All questions were answered, and informed consent                            was obtained. Prior Anticoagulants: The patient has                            taken Plavix (clopidogrel), last dose was 7 days                            prior to procedure. ASA Grade Assessment: III - A  patient with severe systemic disease. After                            reviewing the risks and benefits, the patient was                            deemed in satisfactory condition to undergo the                            procedure.                           After obtaining informed consent, the colonoscope                            was passed under direct vision. Throughout  the                            procedure, the patient's blood pressure, pulse, and                            oxygen saturations were monitored continuously. The                            EC-[id number] ([id number]) scope was introduced through                            the anus and advanced to the the cecum, identified                            by appendiceal orifice and ileocecal valve. The                            colonoscopy was performed without difficulty. The                            patient tolerated the procedure well. The quality                            of the bowel preparation was good. The ileocecal                            valve, the appendiceal orifice and the rectum were                            photographed. Scope In: 9:53:47 AM Scope Out: 10:21:19 AM Total Procedure Duration: 0 hours 27 minutes 32 seconds  Findings:      The perianal and digital rectal examinations were normal.      The entire examined colon appeared normal.There were no signs of active       ulcerative colitis Four quadrant mucosal biopsies were performed every       10 cm from the cecum to the rectum. A total of 32 colonic mucosal       biopsies were performed to look for  mucosal dysplasia. Impression:               - The entire examined colon is normal.                           - No specimens collected. Moderate Sedation:      N/A- Per Anesthesia Care Recommendation:           - Patient has a contact number available for                            emergencies. The signs and symptoms of potential                            delayed complications were discussed with the                            patient. Return to normal activities tomorrow.                            Written discharge instructions were provided to the                            patient.                           - Repeat colonoscopy date to be determined after                            pending pathology results are reviewed  for                            surveillance.                           - Resume previous diet.                           - Continue present medications. Procedure Code(s):        --- Professional ---                           Z6109G0121, Colorectal cancer screening; colonoscopy on                            individual not meeting criteria for high risk Diagnosis Code(s):        --- Professional ---                           Z12.11, Encounter for screening for malignant                            neoplasm of colon CPT copyright 2016 American Medical Association. All rights reserved. The codes documented in this report are preliminary and upon coder review may  be revised to meet current compliance requirements. Danise EdgeMartin Glennice Marcos, MD Charolett BumpersMartin K Henson Fraticelli, MD 07/03/2016 10:29:21 AM This report has been signed electronically.  Number of Addenda: 0 

## 2016-07-03 NOTE — H&P (Signed)
Procedure: Surveillance colonoscopy. Chronic universal ulcerative colitis.  History: The patient is a 75 year old male born 9//1942. 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 He is scheduled to undergo a surveillance colonoscopy today.  Medication allergies: Aspirin causes GI upset.  Past medical history: Single vessel coronary artery disease. Drug eluting coronary artery stent placed in February 2006. Paroxysmal atrial fibrillation. Hypertension. Osteoarthritis. Universal ulcerative colitis. Gastroesophageal reflux. Cardiac ablation therapy to treat atrial fibrillation. Laparoscopic cholecystectomy. Rotator cuff surgery. Knee surgery to treat meniscal tear.  Exam: The patient is alert and lying comfortably on the endoscopy stretcher. Abdomen is soft and nontender to palpation. Lungs are clear to auscultation. Cardiac exam reveals a regular rhythm.  Plan: Proceed with surveillance colonoscopy and perform surveillance mucosal biopsies to rule out dysplasia.

## 2016-07-03 NOTE — Transfer of Care (Signed)
Immediate Anesthesia Transfer of Care Note  Patient: Willie Gonzalez  Procedure(s) Performed: Procedure(s): COLONOSCOPY WITH PROPOFOL (N/A)  Patient Location: PACU  Anesthesia Type:MAC  Level of Consciousness: awake, alert , oriented and patient cooperative  Airway & Oxygen Therapy: Patient Spontanous Breathing and Patient connected to face mask oxygen  Post-op Assessment: Report given to RN, Post -op Vital signs reviewed and stable and Patient moving all extremities X 4  Post vital signs: stable  Last Vitals:  Vitals:   07/03/16 0915 07/03/16 1025  BP: (!) 163/85 109/71  Pulse: 82 68  Resp: (!) 22 12  Temp: 36.7 C 36.4 C    Last Pain:  Vitals:   07/03/16 1025  TempSrc: Oral         Complications: No apparent anesthesia complications

## 2016-07-03 NOTE — Anesthesia Preprocedure Evaluation (Addendum)
Anesthesia Evaluation  Patient identified by MRN, date of birth, ID band Patient awake    Reviewed: Allergy & Precautions, NPO status , Patient's Chart, lab work & pertinent test results  History of Anesthesia Complications Negative for: history of anesthetic complications  Airway Mallampati: II  TM Distance: >3 FB Neck ROM: Full    Dental  (+) Dental Advisory Given,    Pulmonary neg shortness of breath, neg sleep apnea, neg COPD, neg recent URI, former smoker,    Pulmonary exam normal breath sounds clear to auscultation       Cardiovascular Exercise Tolerance: Good hypertension, Pt. on medications + CAD and + Cardiac Stents (DES to LAD in 2006, on Plavix (last dose 9/26))  (-) Orthopnea and (-) PND + dysrhythmias (paroxysmal afib s/p ablation in 2009)  Rhythm:Regular Rate:Normal  HLD  EKG 05/31/2016: NSR  stress test in 2009 showing normal perfusion   Neuro/Psych negative neurological ROS     GI/Hepatic Neg liver ROS, GERD  Medicated,Ulcerative colitis   Endo/Other  negative endocrine ROS  Renal/GU negative Renal ROS     Musculoskeletal  (+) Arthritis ,   Abdominal   Peds  Hematology negative hematology ROS (+)   Anesthesia Other Findings   Reproductive/Obstetrics                            Anesthesia Physical Anesthesia Plan  ASA: III  Anesthesia Plan: MAC   Post-op Pain Management:    Induction: Intravenous  Airway Management Planned: Natural Airway and Nasal Cannula  Additional Equipment:   Intra-op Plan:   Post-operative Plan:   Informed Consent: I have reviewed the patients History and Physical, chart, labs and discussed the procedure including the risks, benefits and alternatives for the proposed anesthesia with the patient or authorized representative who has indicated his/her understanding and acceptance.   Dental advisory given  Plan Discussed with:  CRNA  Anesthesia Plan Comments:        Anesthesia Quick Evaluation

## 2016-07-03 NOTE — Anesthesia Postprocedure Evaluation (Signed)
Anesthesia Post Note  Patient: [name]  Procedure(s) Performed: Procedure(s) (LRB): COLONOSCOPY WITH PROPOFOL (N/A)  Patient location during evaluation: PACU Anesthesia Type: MAC Level of consciousness: awake and alert Pain management: pain level controlled Vital Signs Assessment: post-procedure vital signs reviewed and stable Respiratory status: spontaneous breathing, nonlabored ventilation and respiratory function stable Cardiovascular status: stable and blood pressure returned to baseline Anesthetic complications: no    Last Vitals:  Vitals:   07/03/16 0915 07/03/16 1025  BP: (!) 163/85 109/71  Pulse: 82 68  Resp: (!) 22 12  Temp: 36.7 C 36.4 C    Last Pain:  Vitals:   07/03/16 1025  TempSrc: Oral                 Linton RumpJennifer Dickerson David Towson

## 2016-07-04 ENCOUNTER — Encounter (HOSPITAL_COMMUNITY): Payer: Self-pay | Admitting: Gastroenterology

## 2016-08-08 ENCOUNTER — Other Ambulatory Visit: Payer: Self-pay | Admitting: Cardiology

## 2016-08-30 ENCOUNTER — Other Ambulatory Visit: Payer: Self-pay | Admitting: Cardiology

## 2016-12-17 ENCOUNTER — Telehealth: Payer: Self-pay | Admitting: Cardiology

## 2016-12-17 NOTE — Telephone Encounter (Signed)
New message    Pt is calling stating he had Arrhythmia on Friday and Saturday that lasted a while. He says he has no other issues or symptoms. Appt was scheduled with Dr. Anne FuSkains on 01/14/17 at 3:20pm.

## 2016-12-17 NOTE — Telephone Encounter (Signed)
Thank you for the update. If palpitations once again occur, we will have low threshold to place event monitor to hopefully capture if there is any evidence of atrial fibrillation. Donato SchultzMark Izac Faulkenberry, MD

## 2016-12-17 NOTE — Telephone Encounter (Signed)
Spoke with pt who has a history of At Fib with [facility].  Pt is reporting his heart skipping on Friday starting around 5:30 pm and lasting about 9 hours.  The same symptoms occurred again on Saturday and lasted about 1 and 1/2 hrs.  Both times they resolved on their own.  BP 137/88 HR between 70-72 BPM.  Pt reports it did not feel like the At Fib he has in the past.  No other s/s.  He does have RX for Metoprolol BID prn and was advised OK to use this is palpitations re-occur.  Advised to c/b if they reoccur.  Aware I will forward this to Dr Anne FuSkains for review.  Pt did not want to see PA.  Scheduled for eval with Dr Anne FuSkains for Tuesday 3/27.

## 2016-12-18 ENCOUNTER — Telehealth: Payer: Self-pay | Admitting: Cardiology

## 2016-12-18 NOTE — Telephone Encounter (Signed)
Please view telephone note from 3/19

## 2016-12-18 NOTE — Telephone Encounter (Signed)
Attempted to call back but NA. VM was not identified so I didn't leave a message.

## 2016-12-18 NOTE — Telephone Encounter (Signed)
Pt aware of Dr Anne FuSkains recommendations and will be here for appt as scheduled next week.

## 2016-12-18 NOTE — Telephone Encounter (Signed)
Patient returning your call, thanks. °

## 2016-12-25 ENCOUNTER — Encounter: Payer: Self-pay | Admitting: Cardiology

## 2016-12-25 ENCOUNTER — Ambulatory Visit (INDEPENDENT_AMBULATORY_CARE_PROVIDER_SITE_OTHER): Payer: Federal, State, Local not specified - PPO | Admitting: Cardiology

## 2016-12-25 VITALS — BP 142/88 | HR 78 | Ht 68.0 in | Wt 158.1 lb

## 2016-12-25 DIAGNOSIS — I10 Essential (primary) hypertension: Secondary | ICD-10-CM | POA: Diagnosis not present

## 2016-12-25 DIAGNOSIS — I480 Paroxysmal atrial fibrillation: Secondary | ICD-10-CM

## 2016-12-25 DIAGNOSIS — E7800 Pure hypercholesterolemia, unspecified: Secondary | ICD-10-CM | POA: Diagnosis not present

## 2016-12-25 NOTE — Patient Instructions (Signed)
Medication Instructions:  The current medical regimen is effective;  continue present plan and medications.  Follow-Up: Follow up in August, 2018.  If you need a refill on your cardiac medications before your next appointment, please call your pharmacy.  Thank you for choosing [facility]!!    [employer]

## 2016-12-25 NOTE — Progress Notes (Signed)
[address] [place], [place]  [address] Phone: [phone] Fax:  [phone]  Date:  12/25/2016   ID:  Willie Gonzalez, DOB 05/24/1941, MRN [id number]  PCP:  Charolett BumpersJOHNSON,MARTIN K, MD   History of Present Illness: Willie Gonzalez is a 76 y.o. male with mid LAD/diagonal coronary artery disease in 2006 with drug eluting stent, last stress test in 2009 showing normal perfusion, 10 minutes of exercise, paroxysmal atrial fibrillation post ablation 2009 with no recurrence, GERD, on Plavix (had prior aspirin intolerance but no allergy) with hyperlipidemia.  He rides his bike without any significant difficulty. He has seen Dr. Laural BenesJohnson. He is pleased with his symptoms. He has not had anypalpitations or any chest discomfort. He is no longer riding his bicycle on [address]. [address].  First bout of afib for over 5 years which lasted for 18 hours. Had a heavy exercise routine the day before. Denies any chest pain.  Doing very well,  05/31/16 - few skips here and there. Too much caffiene will trigger. Early 9/17. Otherwise doing well. He did have some bouts of plantar fasciitis. Still riding his bicycle. Unfortunately, his wife died May 2017 from Alzheimer's.  12/26/15 - March 15 hesitate then boom, pulse 70. Took metop. skips lasted for 8 hours. Second day, came on again 530. Only one hour. That Friday, bike, tree tired. Has not had since. No chest pain.   Wt Readings from Last 3 Encounters:  12/25/16 158 lb 1.9 oz (71.7 kg)  07/03/16 160 lb (72.6 kg)  05/31/16 162 lb 1.9 oz (73.5 kg)     Past Medical History:  Diagnosis Date  . Coronary atherosclerosis of unspecified type of vessel, native or graft    coronary stent- Dr. Anne FuSkains follows LOV [phone] Epic  . DJD (degenerative joint disease)   . GERD (gastroesophageal reflux disease)   . HTN (hypertension)   . Hypercholesteremia   . Paroxysmal atrial fibrillation (HCC) 09/07/2013   [facility], Dr. Sampson GoonFitzgerald   .  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 CARDIAC CATHETERIZATION    . COLONOSCOPY WITH PROPOFOL N/A 07/03/2016   Procedure: COLONOSCOPY WITH PROPOFOL;  Surgeon: Charolett BumpersMartin K Johnson, MD;  Location: [facility];  Service: Endoscopy;  Laterality: N/A;  . lap choley    . ROTATOR CUFF REPAIR Right     Current Outpatient Prescriptions  Medication Sig Dispense Refill  . clopidogrel (PLAVIX) 75 MG tablet TAKE 1 TABLET BY MOUTH EVERY DAY 30 tablet 8  . co-enzyme Q-10 30 MG capsule Take 30 mg by mouth daily.    . hydrochlorothiazide (HYDRODIURIL) 25 MG tablet TAKE ONE TABLET BY MOUTH DAILY. 30 tablet 10  . KLOR-CON M20 20 MEQ tablet TAKE 1 TABLET BY MOUTH EVERY DAY 90 tablet 3  . lansoprazole (PREVACID) 15 MG capsule Take 15 mg by mouth daily at 12 noon.    . Magnesium 100 MG CAPS Take 100 mg by mouth every other day.    . Multiple Vitamin (MULTIVITAMIN) capsule Take 1 capsule by mouth daily.    . Probiotic Product (PROBIOTIC-10 PO) Take 1 tablet by mouth daily.    . simvastatin (ZOCOR) 40 MG tablet TAKE 1 TABLET (40 MG TOTAL) BY MOUTH EVERY OTHER DAY. 45 tablet 2   No current facility-administered medications for this visit.     Allergies:    Allergies  Allergen Reactions  . Aspirin  Stomach issues    Social History:  The patient  reports that he quit smoking about 59 years ago. He has never used smokeless tobacco. He reports that he does not drink alcohol or use drugs.   ROS:  Please see the history of present illness.   No syncope, no bleeding, no orthopnea, no PND    PHYSICAL EXAM: VS:  BP (!) 142/88   Pulse 78   Ht 5\' 8"  (1.727 m)   Wt 158 lb 1.9 oz (71.7 kg)   BMI 24.04 kg/m  Well nourished, well developed, in no acute distress  HEENT: normal  Neck: no JVD  Cardiac:  normal S1, S2; RRR; no murmur  Lungs:  clear to auscultation bilaterally, no wheezing, rhonchi or rales  Abd: soft, nontender, no hepatomegaly    Ext: no edema  Skin: warm and dry  Neuro: no focal abnormalities noted  Change in physical exam from prior  EKG:  EKG 05/31/16-sinus rhythm, 68, no other abnormality is. 06/01/14 - Sinus rhythm,70, no abnormalities    ASSESSMENT AND PLAN:   Paroxysmal atrial fibrillation  - He felt a rare episode on March 15 and March 16 that by description sound like PVCs although he states that this is somewhat similar to the way it felt prior to his ablation many years ago. He has not had any since. He took an old Toprol 25 mg that he had from a few years back. I offered him a monitor but he said he sporadically feels these and does not wish to wear a 30 day event monitor. I suggested potential use of [employer]. He will try this. Obviously, if atrial fibrillation returns, we will need to discuss anticoagulation. He can print out a copy of any adverse arrhythmias and bring them to the front desk if necessary.  Dr. Sampson Goon at [facility] performed ablation approximately 6 years ago.   Coronary artery disease  - Continuing with Plavix, early generation stent  Hyperlipidemia  - simvastatin without any difficulties.    1. Year follow up.  Signed, Donato Schultz, MD [facility]  12/25/2016 12:05 PM

## 2016-12-27 ENCOUNTER — Ambulatory Visit (INDEPENDENT_AMBULATORY_CARE_PROVIDER_SITE_OTHER): Payer: Federal, State, Local not specified - PPO

## 2016-12-27 ENCOUNTER — Encounter: Payer: Self-pay | Admitting: Cardiology

## 2016-12-27 ENCOUNTER — Telehealth: Payer: Self-pay | Admitting: Cardiology

## 2016-12-27 DIAGNOSIS — I480 Paroxysmal atrial fibrillation: Secondary | ICD-10-CM | POA: Diagnosis not present

## 2016-12-27 NOTE — Telephone Encounter (Signed)
**Note De-Identified Willie Gonzalez Obfuscation** The pt is scheduled to come in for a EKG as soon as he gets here. He states that he is on his way to the office.

## 2016-12-27 NOTE — Telephone Encounter (Signed)
New Message:   Pt saw he saw Dr Anne FuSkains the other day and he told him if he had palpitations to come and have an ekg. Pt having palpitations,he wants to come in for an ekg.

## 2016-12-27 NOTE — Progress Notes (Signed)
**Note De-Identified Tyannah Sane Obfuscation** 1.) Reason for visit: EKG  2.) Name of MD requesting visit: Dr Anne FuSkains  3.) H&P: the pt called today with c/o palpitations and was advised by Dr Anne FuSkains at his last OV on 12/25/16 to call the office if he started having palpitations/PAF. The pt was asked to come in for an EKG.  4.)  Assessment and plan per MD: EKG obtained and given to Dr Anne FuSkains for his review. Per Dr Anne FuSkains the pt is advised that he is not in A-Fib and to continue his current medical therapy. The pt verbalized understanding and was appreciative for visit to get EKG.

## 2017-01-01 ENCOUNTER — Other Ambulatory Visit: Payer: Self-pay | Admitting: *Deleted

## 2017-01-01 MED ORDER — METOPROLOL SUCCINATE ER 25 MG PO TB24: 25.0000 mg | ORAL_TABLET | Freq: Every day | ORAL | 11 refills | Status: DC

## 2017-01-14 ENCOUNTER — Ambulatory Visit: Payer: Federal, State, Local not specified - PPO | Admitting: Cardiology

## 2017-02-07 ENCOUNTER — Other Ambulatory Visit: Payer: Self-pay | Admitting: Gastroenterology

## 2017-02-07 ENCOUNTER — Ambulatory Visit
Admission: RE | Admit: 2017-02-07 | Discharge: 2017-02-07 | Disposition: A | Discharge status: Home / Self Care | Payer: Federal, State, Local not specified - PPO | Source: Ambulatory Visit | Attending: Gastroenterology | Admitting: Gastroenterology

## 2017-02-07 DIAGNOSIS — M25571 Pain in right ankle and joints of right foot: Secondary | ICD-10-CM

## 2017-04-26 ENCOUNTER — Telehealth: Payer: Self-pay | Admitting: Cardiology

## 2017-04-26 ENCOUNTER — Encounter: Payer: Self-pay | Admitting: Cardiology

## 2017-04-26 DIAGNOSIS — E785 Hyperlipidemia, unspecified: Secondary | ICD-10-CM

## 2017-04-26 DIAGNOSIS — Z0000 Encounter for general adult medical examination without abnormal findings: Secondary | ICD-10-CM

## 2017-04-26 NOTE — Telephone Encounter (Signed)
New message      Mr Willie Gonzalez is returning Willie Gonzalez call , he is not sure what it was regarding

## 2017-04-26 NOTE — Telephone Encounter (Signed)
Stop HCTZ. Worried about gout. This can exacerbate gout. Make sure he has visit with PCP (Dr. Laural BenesJohnson has retired) as well. Follow BP. Check labs, CMET, CBC, Lipid panel when he requested.   Thanks.   Willie SchultzMark Wallace Gappa, MD

## 2017-04-26 NOTE — Telephone Encounter (Signed)
Spoke with pt  See  email .Willie Gonzalez/cy

## 2017-04-26 NOTE — Telephone Encounter (Signed)
Pt aware to stop hctz and  F/u with md for gout  per pt has appt with  Rheumatologists and also will have labs done 8-16 ./cy

## 2017-04-26 NOTE — Telephone Encounter (Signed)
Lm to call back ./cy 

## 2017-04-29 ENCOUNTER — Encounter: Payer: Self-pay | Admitting: Cardiology

## 2017-04-29 NOTE — Telephone Encounter (Signed)
Please set him up an appt with Lawson FiscalLori or Vernona RiegerLaura to discuss. Also encourage establishing a new PCP to help guide his BP mgt in the setting of recently stopping HCTZ for concerns of gout. Thanks.   Willie SchultzMark Goran Olden, MD

## 2017-04-30 ENCOUNTER — Telehealth: Payer: Self-pay | Admitting: *Deleted

## 2017-04-30 NOTE — Telephone Encounter (Signed)
Called pt to discuss the advise request he had sent in RE: his BP since stopping the HCTZ d/t gout s/s.  Pt will continue to monitor BP and keep a log to bring in to his appt with Dr Anne FuSkains in August.  He will c/b if his BP becomes consistently elevated or he will f/u with his PCP.

## 2017-05-02 ENCOUNTER — Other Ambulatory Visit: Payer: Self-pay | Admitting: Cardiology

## 2017-05-13 ENCOUNTER — Encounter: Payer: Self-pay | Admitting: Cardiology

## 2017-05-16 ENCOUNTER — Other Ambulatory Visit: Payer: Federal, State, Local not specified - PPO | Admitting: *Deleted

## 2017-05-16 ENCOUNTER — Other Ambulatory Visit: Payer: Self-pay | Admitting: Cardiology

## 2017-05-16 DIAGNOSIS — Z0000 Encounter for general adult medical examination without abnormal findings: Secondary | ICD-10-CM

## 2017-05-16 DIAGNOSIS — E785 Hyperlipidemia, unspecified: Secondary | ICD-10-CM

## 2017-05-16 LAB — LIPID PANEL
Chol/HDL Ratio: 2.7 ratio (ref 0.0–5.0)
Cholesterol, Total: 132 mg/dL (ref 100–199)
HDL: 49 mg/dL (ref >39)
LDL Calculated: 55 mg/dL (ref 0–99)
Triglycerides: 140 mg/dL (ref 0–149)
VLDL Cholesterol Cal: 28 mg/dL (ref 5–40)

## 2017-05-16 LAB — COMPREHENSIVE METABOLIC PANEL
ALT: 13 IU/L (ref 0–44)
AST: 20 IU/L (ref 0–40)
Albumin/Globulin Ratio: 2.1 (ref 1.2–2.2)
Albumin: 4.9 g/dL — ABNORMAL HIGH (ref 3.5–4.8)
Alkaline Phosphatase: 50 IU/L (ref 39–117)
BUN/Creatinine Ratio: 10 (ref 10–24)
BUN: 12 mg/dL (ref 8–27)
Bilirubin Total: 2.1 mg/dL — ABNORMAL HIGH (ref 0.0–1.2)
CO2: 25 mmol/L (ref 20–29)
Calcium: 9.8 mg/dL (ref 8.6–10.2)
Chloride: 103 mmol/L (ref 96–106)
Creatinine, Ser: 1.16 mg/dL (ref 0.76–1.27)
GFR calc Af Amer: 71 mL/min/{1.73_m2} (ref >59)
GFR calc non Af Amer: 62 mL/min/{1.73_m2} (ref >59)
Globulin, Total: 2.3 g/dL (ref 1.5–4.5)
Glucose: 106 mg/dL — ABNORMAL HIGH (ref 65–99)
Potassium: 3.8 mmol/L (ref 3.5–5.2)
Sodium: 143 mmol/L (ref 134–144)
Total Protein: 7.2 g/dL (ref 6.0–8.5)

## 2017-05-16 LAB — CBC WITH DIFFERENTIAL/PLATELET
Basophils Absolute: 0 10*3/uL (ref 0.0–0.2)
Basos: 0 %
EOS (ABSOLUTE): 0.1 10*3/uL (ref 0.0–0.4)
Eos: 2 %
Hematocrit: 45.8 % (ref 37.5–51.0)
Hemoglobin: 16.2 g/dL (ref 13.0–17.7)
Immature Grans (Abs): 0 10*3/uL (ref 0.0–0.1)
Immature Granulocytes: 0 %
Lymphocytes Absolute: 2.6 10*3/uL (ref 0.7–3.1)
Lymphs: 42 %
MCH: 30.7 pg (ref 26.6–33.0)
MCHC: 35.4 g/dL (ref 31.5–35.7)
MCV: 87 fL (ref 79–97)
Monocytes Absolute: 0.5 10*3/uL (ref 0.1–0.9)
Monocytes: 8 %
Neutrophils Absolute: 3 10*3/uL (ref 1.4–7.0)
Neutrophils: 48 %
Platelets: 209 10*3/uL (ref 150–379)
RBC: 5.27 x10E6/uL (ref 4.14–5.80)
RDW: 12.4 % (ref 12.3–15.4)
WBC: 6.2 10*3/uL (ref 3.4–10.8)

## 2017-05-17 ENCOUNTER — Telehealth: Payer: Self-pay | Admitting: Cardiology

## 2017-05-17 NOTE — Telephone Encounter (Signed)
lmtcb

## 2017-05-17 NOTE — Telephone Encounter (Signed)
New message ° ° ° ° °Returning a call to the nurse to get lab results °

## 2017-05-20 NOTE — Telephone Encounter (Signed)
Pt has been notified of lab results done 05/16/17

## 2017-05-31 ENCOUNTER — Encounter: Payer: Self-pay | Admitting: Cardiology

## 2017-05-31 ENCOUNTER — Ambulatory Visit (INDEPENDENT_AMBULATORY_CARE_PROVIDER_SITE_OTHER): Payer: Federal, State, Local not specified - PPO | Admitting: Cardiology

## 2017-05-31 VITALS — BP 146/88 | HR 84 | Ht 68.0 in | Wt 162.6 lb

## 2017-05-31 DIAGNOSIS — Z79899 Other long term (current) drug therapy: Secondary | ICD-10-CM

## 2017-05-31 DIAGNOSIS — E785 Hyperlipidemia, unspecified: Secondary | ICD-10-CM

## 2017-05-31 DIAGNOSIS — I10 Essential (primary) hypertension: Secondary | ICD-10-CM

## 2017-05-31 DIAGNOSIS — I480 Paroxysmal atrial fibrillation: Secondary | ICD-10-CM | POA: Diagnosis not present

## 2017-05-31 MED ORDER — LOSARTAN POTASSIUM 25 MG PO TABS: 25.0000 mg | ORAL_TABLET | Freq: Every day | ORAL | 3 refills | Status: DC

## 2017-05-31 NOTE — Patient Instructions (Signed)
Medication Instructions:  Please discontinue your HCTZ and potassium supplement. Start Losartan 25 mg once a day. Continue all other medications as listed.  Labwork: Please have blood work September 10th (BMP)  Follow-Up: Follow up in 1 year with Dr. Anne FuSkains.  You will receive a letter in the mail 2 months before you are due.  Please call us when you receive this letter to schedule your follow up appointment.  If you need a refill on your cardiac medications before your next appointment, please call your pharmacy.  Thank you for choosing [facility]!!

## 2017-05-31 NOTE — Progress Notes (Signed)
[address] [place], [place]  [address] Phone: [phone] Fax:  [phone]  Date:  05/31/2017   ID:  Willie BattiestWayne W Yaney, DOB 10/23/1940, MRN [id number]  PCP:  Marden NobleGates, Robert, MD   History of Present Illness: Willie Gonzalez is a 76 y.o. male with mid LAD/diagonal coronary artery disease in 2006 with drug eluting stent, last stress test in 2009 showing normal perfusion, 10 minutes of exercise, paroxysmal atrial fibrillation post ablation 2009 with no recurrence, GERD, on Plavix (had prior aspirin intolerance but no allergy) with hyperlipidemia.  He rides his bike without any significant difficulty. He has seen Dr. Laural BenesJohnson. He is pleased with his symptoms. He has not had anypalpitations or any chest discomfort. He is no longer riding his bicycle on [address].  First bout of afib for over 5 years which lasted for 18 hours. Had a heavy exercise routine the day before. Denies any chest pain.  Doing very well,  05/31/16 - few skips here and there. Too much caffiene will trigger. Early 9/17. Otherwise doing well. He did have some bouts of plantar fasciitis. Still riding his bicycle. Unfortunately, his wife died May 2017 from Alzheimer's.  12/26/15 - March 15 hesitate then boom, pulse 70. Took metop. skips lasted for 8 hours. Second day, came on again 530. Only one hour. That Friday, bike, tree tired. Has not had since. No chest pain.  05/31/17-overall been doing quite well. He did have some difficulty with toe pain, small toe pain and stopped his HCTZ in the pain went away. His blood pressure however did increase. He showed me a list of blood pressures daily. After stopping HCTZ his blood pressure went as high as 171/96. He then restarted it on his own and had mild toe pain afterwards. We decided to start losartan. Denies chest pain, syncope, bleeding, orthopnea.  He did have one episode of dizziness where [place]ally 10 miles.  He called the helpline, thought dehydration may be playing a role. He has not had any episodes since.  Wt Readings from Last 3 Encounters:  05/31/17 162 lb 9.6 oz (73.8 kg)  12/25/16 158 lb 1.9 oz (71.7 kg)  07/03/16 160 lb (72.6 kg)     Past Medical History:  Diagnosis Date  . Coronary atherosclerosis of unspecified type of vessel, native or graft    coronary stent- Dr. Anne FuSkains follows LOV [phone] Epic  . DJD (degenerative joint disease)   . GERD (gastroesophageal reflux disease)   . HTN (hypertension)   . Hypercholesteremia   . Paroxysmal atrial fibrillation (HCC) 09/07/2013   [facility], Dr. Sampson GoonFitzgerald   . 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 CARDIAC CATHETERIZATION    . COLONOSCOPY WITH PROPOFOL N/A 07/03/2016   Procedure: COLONOSCOPY WITH PROPOFOL;  Surgeon: Charolett BumpersMartin K Johnson, MD;  Location: [facility];  Service: Endoscopy;  Laterality: N/A;  . lap choley    . ROTATOR CUFF REPAIR Right     Current Outpatient Prescriptions  Medication Sig Dispense Refill  . clopidogrel (PLAVIX) 75 MG tablet Take 1 tablet (75 mg total) by mouth daily. 30 tablet 8  . co-enzyme Q-10 30 MG capsule Take 30 mg by mouth daily.    . lansoprazole (PREVACID) 15 MG capsule Take 15 mg by mouth daily at 12 noon.    . Magnesium 100 MG CAPS  Take 100 mg by mouth every other day.    . metoprolol succinate (TOPROL XL) 25 MG 24 hr tablet Take 1 tablet (25 mg total) by mouth daily. 30 tablet 11  . Multiple Vitamin (MULTIVITAMIN) capsule Take 1 capsule by mouth daily.    . Probiotic Product (PROBIOTIC-10 PO) Take 1 tablet by mouth daily.    . simvastatin (ZOCOR) 40 MG tablet TAKE 1 TABLET (40 MG TOTAL) BY MOUTH EVERY OTHER DAY. 45 tablet 2  . losartan (COZAAR) 25 MG tablet Take 1 tablet (25 mg total) by mouth daily. 90 tablet 3   No current facility-administered medications for this visit.     Allergies:    Allergies  Allergen  Reactions  . Aspirin     Stomach issues    Social History:  The patient  reports that he quit smoking about 59 years ago. He has never used smokeless tobacco. He reports that he does not drink alcohol or use drugs.   ROS:  Please see the history of present illness.   No syncope, no bleeding, no orthopnea, no PND    PHYSICAL EXAM: VS:  BP (!) 146/88   Pulse 84   Ht 5\' 8"  (1.727 m)   Wt 162 lb 9.6 oz (73.8 kg)   BMI 24.72 kg/m  Well nourished, well developed, in no acute distress  HEENT: normal  Neck: no JVD  Cardiac:  normal S1, S2; RRR; no murmur  Lungs:  clear to auscultation bilaterally, no wheezing, rhonchi or rales  Abd: soft, nontender, no hepatomegaly  Ext: no edema  Skin: warm and dry  Neuro: no focal abnormalities noted  Change in physical exam from prior  EKG:  EKG 05/31/16-sinus rhythm, 68, no other abnormality is. 06/01/14 - Sinus rhythm,70, no abnormalities    ASSESSMENT AND PLAN:   Paroxysmal atrial fibrillation  - He has not been having any atrial fibrillation. Obviously if this returns, he needs anticoagulation. We have discussed.  Dr. Sampson Goon at [facility] performed ablation approximately 7 years ago.   Coronary artery disease  - Continuing with Plavix, early generation stent  - No aspirin. Has had dyspepsia in the past with this.  Hyperlipidemia  - simvastatin without any difficulties.  - LDL 55 excellent  Essential hypertension  - Stopping HCTZ because of small toe pain. He says after stopping the medication it felt better.  - Starting losartan 25 mg once a day. Checking basic metabolic profile in about 2 weeks.  - He will be establishing with Dr. Kevan Ny in December.   1 Year follow up.  Signed, Donato Schultz, MD [facility]  05/31/2017 10:29 AM

## 2017-06-07 ENCOUNTER — Other Ambulatory Visit: Payer: Self-pay | Admitting: Cardiology

## 2017-06-07 NOTE — Telephone Encounter (Signed)
AVS Reports   Date/Time Report Action User  05/31/2017 10:29 AM After Visit Summary Printed Sharin GraveFleming, Pamela J, RN  Patient Instructions   Medication Instructions:  Please discontinue your HCTZ and potassium supplement.

## 2017-06-10 ENCOUNTER — Other Ambulatory Visit: Payer: Federal, State, Local not specified - PPO | Admitting: *Deleted

## 2017-06-10 ENCOUNTER — Encounter: Payer: Self-pay | Admitting: Cardiology

## 2017-06-10 ENCOUNTER — Other Ambulatory Visit: Payer: Self-pay | Admitting: Cardiology

## 2017-06-10 DIAGNOSIS — I10 Essential (primary) hypertension: Secondary | ICD-10-CM

## 2017-06-10 DIAGNOSIS — Z79899 Other long term (current) drug therapy: Secondary | ICD-10-CM

## 2017-06-10 LAB — BASIC METABOLIC PANEL
BUN/Creatinine Ratio: 11 (ref 10–24)
BUN: 12 mg/dL (ref 8–27)
CO2: 23 mmol/L (ref 20–29)
Calcium: 9.2 mg/dL (ref 8.6–10.2)
Chloride: 104 mmol/L (ref 96–106)
Creatinine, Ser: 1.14 mg/dL (ref 0.76–1.27)
GFR calc Af Amer: 72 mL/min/{1.73_m2} (ref >59)
GFR calc non Af Amer: 63 mL/min/{1.73_m2} (ref >59)
Glucose: 104 mg/dL — ABNORMAL HIGH (ref 65–99)
Potassium: 4.1 mmol/L (ref 3.5–5.2)
Sodium: 141 mmol/L (ref 134–144)

## 2017-06-11 NOTE — Telephone Encounter (Signed)
AVS Reports   Date/Time Report Action User  05/31/2017 10:29 AM After Visit Summary Printed Sharin GraveFleming, Pamela J, RN  Patient Instructions   Medication Instructions:  Please discontinue your HCTZ and potassium supplement. Start Losartan 25 mg once a day. Continue all other medications as listed.

## 2017-06-12 ENCOUNTER — Telehealth: Payer: Self-pay

## 2017-06-12 NOTE — Telephone Encounter (Signed)
Noted  

## 2017-06-12 NOTE — Telephone Encounter (Signed)
Willie Gonzalez is returning your call. He states that he saw the lab results on My Chart and they all look in the normal range and you dont have to give him a call back

## 2017-06-12 NOTE — Telephone Encounter (Signed)
-----   Message from Jake BatheMark C Skains, MD sent at 06/12/2017  6:42 AM EDT ----- Labs good after starting losartan Donato SchultzMark Skains, MD

## 2017-06-12 NOTE — Telephone Encounter (Signed)
Left message to call back for lab results.

## 2017-10-14 ENCOUNTER — Ambulatory Visit: Payer: Federal, State, Local not specified - PPO | Admitting: Sports Medicine

## 2018-01-25 ENCOUNTER — Other Ambulatory Visit: Payer: Self-pay | Admitting: Cardiology

## 2018-02-03 ENCOUNTER — Other Ambulatory Visit: Payer: Self-pay | Admitting: Cardiology

## 2018-03-04 ENCOUNTER — Telehealth: Payer: Self-pay | Admitting: Cardiology

## 2018-03-04 DIAGNOSIS — Z79899 Other long term (current) drug therapy: Secondary | ICD-10-CM

## 2018-03-04 DIAGNOSIS — I480 Paroxysmal atrial fibrillation: Secondary | ICD-10-CM

## 2018-03-04 DIAGNOSIS — I10 Essential (primary) hypertension: Secondary | ICD-10-CM

## 2018-03-04 DIAGNOSIS — E785 Hyperlipidemia, unspecified: Secondary | ICD-10-CM

## 2018-03-04 NOTE — Telephone Encounter (Signed)
New Message   Patient has an appointment scheduled for 09/03. He wants to know if Dr. Anne FuSkains wants him to come in sooner for labs. Please call to discuss.

## 2018-03-04 NOTE — Telephone Encounter (Signed)
Will have Dr Anne FuSkains review chart to determine if labs are required prior to pt's upcoming appt.

## 2018-03-06 NOTE — Telephone Encounter (Signed)
Per pt - no lab has been done at Dr Kevan NyGates office.  Labs ordered and scheduled for 05/29/18.  Pt is aware.

## 2018-03-06 NOTE — Telephone Encounter (Signed)
He does not need labs here if Dr. Kevan NyGates has checked.  If not, check BMET, Lipids, ALT, CBC.  Thanks Donato SchultzMark Carra Brindley, MD

## 2018-04-23 ENCOUNTER — Other Ambulatory Visit: Payer: Self-pay | Admitting: Cardiology

## 2018-04-25 ENCOUNTER — Other Ambulatory Visit: Payer: Self-pay | Admitting: Internal Medicine

## 2018-04-25 ENCOUNTER — Ambulatory Visit
Admission: RE | Admit: 2018-04-25 | Discharge: 2018-04-25 | Disposition: A | Discharge status: Home / Self Care | Payer: Federal, State, Local not specified - PPO | Source: Ambulatory Visit | Attending: Internal Medicine | Admitting: Internal Medicine

## 2018-04-25 DIAGNOSIS — R1032 Left lower quadrant pain: Secondary | ICD-10-CM

## 2018-04-25 MED ORDER — IOPAMIDOL (ISOVUE-300) INJECTION 61%
100.0000 mL | Freq: Once | INTRAVENOUS | Status: AC | PRN
  Administered 2018-04-25: 100 mL via INTRAVENOUS

## 2018-05-01 ENCOUNTER — Other Ambulatory Visit: Payer: Self-pay | Admitting: Cardiology

## 2018-05-02 ENCOUNTER — Encounter: Payer: Self-pay | Admitting: Cardiology

## 2018-05-22 ENCOUNTER — Other Ambulatory Visit: Payer: Self-pay | Admitting: Cardiology

## 2018-05-26 ENCOUNTER — Other Ambulatory Visit: Payer: Self-pay | Admitting: Cardiology

## 2018-05-27 NOTE — Telephone Encounter (Signed)
clopidogrel (PLAVIX) 75 MG tablet  Medication  Date: 05/01/2018 [facility] [address] [facility] Ordering/Authorizing: Jake BatheSkains, Mark C, MD  Order Providers   Prescribing Provider Encounter Provider  Jake BatheSkains, Mark C, MD Jake BatheSkains, Mark C, MD  Outpatient Medication Detail    Disp Refills Start End   clopidogrel (PLAVIX) 75 MG tablet 30 tablet 1 05/01/2018    Sig - Route: Take 1 tablet (75 mg total) by mouth daily. Please keep upcoming appt in September for future refills. Thank you - Oral   Sent to pharmacy as: clopidogrel (PLAVIX) 75 MG tablet   E-Prescribing Status: Receipt confirmed by pharmacy (05/01/2018 11:31 AM EDT)   Pharmacy   [id number] [place]- [place], [place] - [address]

## 2018-05-29 ENCOUNTER — Other Ambulatory Visit: Payer: Federal, State, Local not specified - PPO | Admitting: *Deleted

## 2018-05-29 DIAGNOSIS — I10 Essential (primary) hypertension: Secondary | ICD-10-CM

## 2018-05-29 DIAGNOSIS — E785 Hyperlipidemia, unspecified: Secondary | ICD-10-CM

## 2018-05-29 DIAGNOSIS — I480 Paroxysmal atrial fibrillation: Secondary | ICD-10-CM

## 2018-05-29 DIAGNOSIS — Z79899 Other long term (current) drug therapy: Secondary | ICD-10-CM

## 2018-05-29 LAB — LIPID PANEL
Chol/HDL Ratio: 2.9 ratio (ref 0.0–5.0)
Cholesterol, Total: 126 mg/dL (ref 100–199)
HDL: 44 mg/dL (ref >39)
LDL Calculated: 54 mg/dL (ref 0–99)
Triglycerides: 139 mg/dL (ref 0–149)
VLDL Cholesterol Cal: 28 mg/dL (ref 5–40)

## 2018-06-03 ENCOUNTER — Encounter: Payer: Self-pay | Admitting: Cardiology

## 2018-06-03 ENCOUNTER — Ambulatory Visit: Payer: Federal, State, Local not specified - PPO | Admitting: Cardiology

## 2018-06-03 VITALS — BP 148/82 | HR 75 | Ht 68.0 in | Wt 165.2 lb

## 2018-06-03 DIAGNOSIS — I10 Essential (primary) hypertension: Secondary | ICD-10-CM | POA: Diagnosis not present

## 2018-06-03 DIAGNOSIS — I480 Paroxysmal atrial fibrillation: Secondary | ICD-10-CM

## 2018-06-03 DIAGNOSIS — E785 Hyperlipidemia, unspecified: Secondary | ICD-10-CM | POA: Diagnosis not present

## 2018-06-03 MED ORDER — METOPROLOL SUCCINATE ER 25 MG PO TB24: 25.0000 mg | ORAL_TABLET | Freq: Every day | ORAL | 1 refills | Status: DC | PRN

## 2018-06-03 MED ORDER — LOSARTAN POTASSIUM 25 MG PO TABS: 25.0000 mg | ORAL_TABLET | Freq: Every day | ORAL | 3 refills | Status: DC

## 2018-06-03 MED ORDER — CLOPIDOGREL BISULFATE 75 MG PO TABS: 75.0000 mg | ORAL_TABLET | Freq: Every day | ORAL | 3 refills | Status: DC

## 2018-06-03 NOTE — Patient Instructions (Addendum)

## 2018-06-03 NOTE — Progress Notes (Signed)
[address] [place], [place]  [address] Phone: [phone] Fax:  [phone]  Date:  06/03/2018   ID:  Willie Gonzalez, Willie Gonzalez 11/03/1940, MRN [id number]  PCP:  Marden Noble, MD   History of Present Illness: Willie Gonzalez is a 77 y.o. male with mid LAD/diagonal coronary artery disease in 2006 with drug eluting stent, last stress test in 2009 showing normal perfusion, 10 minutes of exercise, paroxysmal atrial fibrillation post ablation 2009 with no recurrence, GERD, on Plavix (had prior aspirin intolerance but no allergy) with hyperlipidemia.  He rides his bike without any significant difficulty. He has seen Dr. Laural Benes. He is pleased with his symptoms. He has not had anypalpitations or any chest discomfort. He is no longer riding his bicycle on [address].  First bout of afib for over 5 years which lasted for 18 hours. Had a heavy exercise routine the day before. Denies any chest pain.  Doing very well,  05/31/16 - few skips here and there. Too much caffiene will trigger. Early 9/17. Otherwise doing well. He did have some bouts of plantar fasciitis. Still riding his bicycle. Unfortunately, his wife died June 14, 2017from Alzheimer's.  12/26/15 - March 15 hesitate then boom, pulse 70. Took metop. skips lasted for 8 hours. Second day, came on again 530. Only one hour. That Friday, bike, tree tired. Has not had since. No chest pain.  05/31/17-overall been doing quite well. He did have some difficulty with toe pain, small toe pain and stopped his HCTZ in the pain went away. His blood pressure however did increase. He showed me a list of blood pressures daily. After stopping HCTZ his blood pressure went as high as 171/96. He then restarted it on his own and had mild toe pain afterwards. We decided to start losartan. Denies chest pain, syncope, bleeding, orthopnea.  He did have one episode of dizziness where [place] 10 miles.  He called the helpline, thought dehydration may be playing a role. He has not had any episodes since.  06/03/2018- lipid panel showed total cholesterol 126, HDL 44, LDL 54, triglycerides 139.  ALT was normal.  Overall doing quite well.  Still riding his bicycle.  No further dizzy episodes.  No chest pain fevers chills nausea vomiting syncope.  Continues to have some abdominal discomfort despite gallbladder removal.  Wt Readings from Last 3 Encounters:  06/03/18 165 lb 3.2 oz (74.9 kg)  05/31/17 162 lb 9.6 oz (73.8 kg)  12/25/16 158 lb 1.9 oz (71.7 kg)     Past Medical History:  Diagnosis Date  . Coronary atherosclerosis of unspecified type of vessel, native or graft    coronary stent- Dr. Anne Fu follows LOV [phone] Epic  . DJD (degenerative joint disease)   . GERD (gastroesophageal reflux disease)   . HTN (hypertension)   . Hypercholesteremia   . Paroxysmal atrial fibrillation (HCC) 09/07/2013   Ablation in 2009 at [facility], Dr. Sampson Goon   . 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 CARDIAC CATHETERIZATION    . COLONOSCOPY WITH PROPOFOL N/A 07/03/2016   Procedure: COLONOSCOPY WITH PROPOFOL;  Surgeon: Charolett Bumpers, MD;  Location: [facility];  Service: Endoscopy;  Laterality: N/A;  . lap choley    . ROTATOR CUFF REPAIR Right     Current Outpatient Medications  Medication Sig Dispense Refill  . clopidogrel (PLAVIX)  75 MG tablet Take 1 tablet (75 mg total) by mouth daily. 90 tablet 3  . co-enzyme Q-10 30 MG capsule Take 30 mg by mouth daily.    . lansoprazole (PREVACID) 15 MG capsule Take 15 mg by mouth daily at 12 noon.    Marland Kitchen losartan (COZAAR) 25 MG tablet Take 1 tablet (25 mg total) by mouth daily. 90 tablet 3  . metoprolol succinate (TOPROL XL) 25 MG 24 hr tablet Take 1 tablet (25 mg total) by mouth daily as needed. 30 tablet 1  . Misc Natural Products (BETA-SITOSTEROL PLANT STEROLS PO) Take 325 mg by mouth daily.    .  Multiple Vitamin (MULTIVITAMIN) capsule Take 1 capsule by mouth daily.    . Probiotic Product (PROBIOTIC-10 PO) Take 1 tablet by mouth daily.    . simvastatin (ZOCOR) 40 MG tablet TAKE ONE TABLET BY MOUTH EVERY OTHER DAY. Please keep upcoming appt in September for future refills. Thank you 45 tablet 0   No current facility-administered medications for this visit.     Allergies:    Allergies  Allergen Reactions  . Aspirin     Stomach issues    Social History:  The patient  reports that he quit smoking about 60 years ago. He has never used smokeless tobacco. He reports that he does not drink alcohol or use drugs.   ROS:  Please see the history of present illness.   No syncope, no bleeding, no orthopnea, no PND    PHYSICAL EXAM: VS:  BP (!) 148/82   Pulse 75   Ht 5\' 8"  (1.727 m)   Wt 165 lb 3.2 oz (74.9 kg)   SpO2 96%   BMI 25.12 kg/m  GEN: Well nourished, well developed, in no acute distress  HEENT: normal  Neck: no JVD, carotid bruits, or masses Cardiac: RRR; no murmurs, rubs, or gallops,no edema  Respiratory:  clear to auscultation bilaterally, normal work of breathing GI: soft, nontender, nondistended, + BS MS: no deformity or atrophy  Skin: warm and dry, no rash Neuro:  Alert and Oriented x 3, Strength and sensation are intact Psych: euthymic mood, full affect   EKG:  EKG 06/03/2018-sinus rhythm 75 with no other abnormalities-05/31/16-sinus rhythm, 68, no other abnormality is. 06/01/14 - Sinus rhythm,70, no abnormalities    ASSESSMENT AND PLAN:   Paroxysmal atrial fibrillation  - He has not been having any atrial fibrillation. Obviously if this returns, he needs anticoagulation. We have discussed.  Still in sinus rhythm.  No palpitations.  Dr. Sampson Goon at [facility] performed ablation approximately 7 years ago.   Coronary artery disease  - Continuing with Plavix, early generation stent  - No aspirin. Has had dyspepsia in the past with this.  Overall doing quite  well.  Hyperlipidemia  - simvastatin without any difficulties.  - LDL 55 excellent  Essential hypertension  - Stopped HCTZ because of small toe pain previously. He says after stopping the medication it felt better.  - On losartan 25 mg once a day.  Fairly well controlled.     1 Year follow up.  Signed, Donato Schultz, MD [facility]  06/03/2018 3:08 PM

## 2018-06-28 ENCOUNTER — Other Ambulatory Visit: Payer: Self-pay | Admitting: Cardiology

## 2018-07-19 ENCOUNTER — Other Ambulatory Visit: Payer: Self-pay | Admitting: Cardiology

## 2018-08-20 ENCOUNTER — Other Ambulatory Visit: Payer: Self-pay | Admitting: Cardiology

## 2018-08-20 NOTE — Telephone Encounter (Signed)
Outpatient Medication Detail    Disp Refills Start End   losartan (COZAAR) 25 MG tablet 90 tablet 3 06/03/2018    Sig - Route: Take 1 tablet (25 mg total) by mouth daily. - Oral   Sent to pharmacy as: losartan (COZAAR) 25 MG tablet   Notes to Pharmacy: Patient will request refill when needed.   E-Prescribing Status: Receipt confirmed by pharmacy (06/03/2018 3:02 PM EDT)   Pharmacy   [id number], [place] - [address]

## 2019-05-06 ENCOUNTER — Other Ambulatory Visit: Payer: Self-pay | Admitting: Cardiology

## 2019-05-27 ENCOUNTER — Other Ambulatory Visit: Payer: Self-pay | Admitting: *Deleted

## 2019-05-27 DIAGNOSIS — Z79899 Other long term (current) drug therapy: Secondary | ICD-10-CM

## 2019-05-27 DIAGNOSIS — E785 Hyperlipidemia, unspecified: Secondary | ICD-10-CM

## 2019-05-27 NOTE — Progress Notes (Signed)
Order placed for lipid panel per Dr Marlou Porch and appt scheduled for 10/20 as requested.  Responded to pt via MyChart.  From: Jerline Pain, MD  Sent: 05/27/2019 10:30 AM EDT  To: Nuala Alpha, LPN  Subject: FW: Non-Urgent Medical Question           I'm fine with getting him a lipid panel as below. Please order. Thanks     Candee Furbish  ----- Message -----  From: Nuala Alpha, LPN  Sent: 02/01/5464 10:43 AM EDT  To: Jerline Pain, MD, Shellia Cleverly, RN  Subject: Melton Alar: Non-Urgent Medical Question             ----- Message -----  From: Merceda Elks  Sent: 05/26/2019 10:42 AM EDT  To: [address] Triage  Subject: Non-Urgent Medical Question             Dr. Marlou Porch it is Celestia Khat and I just made an appointment for my annual visit on July 28, 2019. How about ordering a complete lipid panel for October 20 and we can have the results to go over together on the 27th.    PAX

## 2019-05-30 ENCOUNTER — Other Ambulatory Visit: Payer: Self-pay | Admitting: Cardiology

## 2019-06-24 ENCOUNTER — Other Ambulatory Visit: Payer: Self-pay | Admitting: Cardiology

## 2019-07-15 ENCOUNTER — Other Ambulatory Visit: Payer: Self-pay | Admitting: Cardiology

## 2019-07-21 ENCOUNTER — Other Ambulatory Visit (HOSPITAL_COMMUNITY)
Admission: RE | Admit: 2019-07-21 | Discharge: 2019-07-21 | Disposition: A | Discharge status: Home / Self Care | Payer: Federal, State, Local not specified - PPO | Source: Ambulatory Visit | Attending: Cardiology | Admitting: Cardiology

## 2019-07-21 ENCOUNTER — Other Ambulatory Visit: Payer: Federal, State, Local not specified - PPO

## 2019-07-21 ENCOUNTER — Other Ambulatory Visit: Payer: Self-pay

## 2019-07-21 ENCOUNTER — Other Ambulatory Visit: Payer: Self-pay | Admitting: *Deleted

## 2019-07-21 DIAGNOSIS — Z79899 Other long term (current) drug therapy: Secondary | ICD-10-CM

## 2019-07-21 DIAGNOSIS — E785 Hyperlipidemia, unspecified: Secondary | ICD-10-CM

## 2019-07-21 LAB — LIPID PANEL
Cholesterol: 156 mg/dL (ref 0–200)
HDL: 56 mg/dL (ref >40)
LDL Cholesterol: 80 mg/dL (ref 0–99)
Total CHOL/HDL Ratio: 2.8 RATIO
Triglycerides: 98 mg/dL (ref <150)
VLDL: 20 mg/dL (ref 0–40)

## 2019-07-28 ENCOUNTER — Ambulatory Visit: Payer: Federal, State, Local not specified - PPO | Admitting: Cardiology

## 2019-07-28 ENCOUNTER — Encounter: Payer: Self-pay | Admitting: Cardiology

## 2019-07-28 ENCOUNTER — Other Ambulatory Visit: Payer: Self-pay

## 2019-07-28 VITALS — BP 136/80 | HR 68 | Ht 68.0 in | Wt 160.0 lb

## 2019-07-28 DIAGNOSIS — Z79899 Other long term (current) drug therapy: Secondary | ICD-10-CM | POA: Diagnosis not present

## 2019-07-28 DIAGNOSIS — I480 Paroxysmal atrial fibrillation: Secondary | ICD-10-CM | POA: Diagnosis not present

## 2019-07-28 DIAGNOSIS — E785 Hyperlipidemia, unspecified: Secondary | ICD-10-CM

## 2019-07-28 DIAGNOSIS — I10 Essential (primary) hypertension: Secondary | ICD-10-CM

## 2019-07-28 MED ORDER — ROSUVASTATIN CALCIUM 10 MG PO TABS: 10.0000 mg | ORAL_TABLET | Freq: Every day | ORAL | 11 refills | Status: DC

## 2019-07-28 NOTE — Patient Instructions (Signed)
Medication Instructions:  Please start  Crestor 10 mg daily. Continue all other medications as listed  *If you need a refill on your cardiac medications before your next appointment, please call your pharmacy*  Lab Work: Please have blood work in 3 months (LIPID/ALT) If you have labs (blood work) drawn today and your tests are completely normal, you will receive your results only by: Marland Kitchen MyChart Message (if you have MyChart) OR . A paper copy in the mail If you have any lab test that is abnormal or we need to change your treatment, we will call you to review the results.  Follow-Up: At [facility], you and your health needs are our priority.  As part of our continuing mission to provide you with exceptional heart care, we have created designated Provider Care Teams.  These Care Teams include your primary Cardiologist (physician) and Advanced Practice Providers (APPs -  Physician Assistants and Nurse Practitioners) who all work together to provide you with the care you need, when you need it.  Your next appointment:   12 months  The format for your next appointment:   In Person  Provider:   You may see Candee Furbish, MD or one of the following Advanced Practice Providers on your designated Care Team:    Truitt Merle, NP  Cecilie Kicks, NP  Kathyrn Drown, NP   Thank you for choosing [facility]!!

## 2019-07-28 NOTE — Progress Notes (Signed)
[address], [address] [place], [place]  [address] Phone: [phone] Fax:  [phone]  Date:  07/28/2019   ID:  Willie, Gonzalez [birth year], MRN [id number]  PCP:  Marden Noble, MD   History of Present Illness: Willie Gonzalez is a 78 y.o. male with mid LAD/diagonal coronary artery disease in 2006 with drug eluting stent, last stress test in 2009 showing normal perfusion, 10 minutes of exercise, paroxysmal atrial fibrillation post ablation 2009 with no recurrence, GERD, on Plavix (had prior aspirin intolerance but no allergy) with hyperlipidemia.  He rides his bike without any significant difficulty. He has seen Dr. Laural Benes. He is pleased with his symptoms. He has not had anypalpitations or any chest discomfort. He is no longer riding his bicycle on [address]. [address].  First bout of afib for over 5 years which lasted for 18 hours. Had a heavy exercise routine the day before. Denies any chest pain.  Doing very well,  05/31/16 - few skips here and there. Too much caffiene will trigger. Early 9/17. Otherwise doing well. He did have some bouts of plantar fasciitis. Still riding his bicycle. Unfortunately, his wife died 06/08/17from Alzheimer's.  12/26/15 - March 15 hesitate then boom, pulse 70. Took metop. skips lasted for 8 hours. Second day, came on again 530. Only one hour. That Friday, bike, tree tired. Has not had since. No chest pain.  05/31/17-overall been doing quite well. He did have some difficulty with toe pain, small toe pain and stopped his HCTZ in the pain went away. His blood pressure however did increase. He showed me a list of blood pressures daily. After stopping HCTZ his blood pressure went as high as 171/96. He then restarted it on his own and had mild toe pain afterwards. We decided to start losartan. Denies chest pain, syncope, bleeding, orthopnea.  He did have one episode of dizziness where [place]ally 10 miles.  He called the helpline, thought dehydration may be playing a role. He has not had any episodes since.  06/03/2018- lipid panel showed total cholesterol 126, HDL 44, LDL 54, triglycerides 139.  ALT was normal.  Overall doing quite well.  Still riding his bicycle.  No further dizzy episodes.  No chest pain fevers chills nausea vomiting syncope.  Continues to have some abdominal discomfort despite gallbladder removal.  07/28/2019-here for coronary disease follow-up.  Overall been doing quite well.  Riding bicycle.  No chest pain fevers chills nausea vomiting.  Doing very well.  We are changing statin over to Crestor.  Wt Readings from Last 3 Encounters:  07/28/19 160 lb (72.6 kg)  06/03/18 165 lb 3.2 oz (74.9 kg)  05/31/17 162 lb 9.6 oz (73.8 kg)     Past Medical History:  Diagnosis Date  . Coronary atherosclerosis of unspecified type of vessel, native or graft    coronary stent- Dr. Anne Fu follows LOV [phone] Epic  . DJD (degenerative joint disease)   . GERD (gastroesophageal reflux disease)   . HTN (hypertension)   . Hypercholesteremia   . Paroxysmal atrial fibrillation (HCC) 09/07/2013   Ablation in 2009 at [facility], Dr. Sampson Goon   . 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 CARDIAC CATHETERIZATION    . COLONOSCOPY WITH PROPOFOL N/A 07/03/2016   Procedure: COLONOSCOPY WITH PROPOFOL;  Surgeon: Charolett Bumpers, MD;  Location: [facility];  Service: Endoscopy;  Laterality: N/A;  . lap choley    . ROTATOR CUFF REPAIR Right     Current Outpatient Medications  Medication Sig Dispense Refill  . clopidogrel (PLAVIX) 75 MG tablet Take 1 tablet (75 mg total) by mouth daily. 90 tablet 0  . co-enzyme Q-10 30 MG capsule Take 30 mg by mouth daily.    . lansoprazole (PREVACID) 15 MG capsule Take 15 mg by mouth daily at 12 noon.    Marland Kitchen losartan (COZAAR) 25 MG tablet Take 1 tablet (25 mg total) by mouth daily. 90 tablet 3  . metoprolol  succinate (TOPROL-XL) 25 MG 24 hr tablet TAKE 1 TABLET (25 MG TOTAL) BY MOUTH DAILY AS NEEDED. 90 tablet 2  . Misc Natural Products (BETA-SITOSTEROL PLANT STEROLS PO) Take 325 mg by mouth daily.    . Multiple Vitamin (MULTIVITAMIN) capsule Take 1 capsule by mouth daily.    . Probiotic Product (PROBIOTIC-10 PO) Take 1 tablet by mouth daily.    . rosuvastatin (CRESTOR) 10 MG tablet Take 1 tablet (10 mg total) by mouth daily. 30 tablet 11   No current facility-administered medications for this visit.     Allergies:    Allergies  Allergen Reactions  . Aspirin     Stomach issues    Social History:  The patient  reports that he quit smoking about 61 years ago. He has never used smokeless tobacco. He reports that he does not drink alcohol or use drugs.   ROS:  Please see the history of present illness.     PHYSICAL EXAM: VS:  BP 136/80   Pulse 68   Ht 5\' 8"  (1.727 m)   Wt 160 lb (72.6 kg)   SpO2 98%   BMI 24.33 kg/m  GEN: Well nourished, well developed, in no acute distress  HEENT: normal  Neck: no JVD, carotid bruits, or masses Cardiac: RRR; no murmurs, rubs, or gallops,no edema  Respiratory:  clear to auscultation bilaterally, normal work of breathing GI: soft, nontender, nondistended, + BS MS: no deformity or atrophy  Skin: warm and dry, no rash Neuro:  Alert and Oriented x 3, Strength and sensation are intact Psych: euthymic mood, full affect    EKG: 07/28/2019-sinus rhythm 68 with no other abnormalities.  Personally reviewed-prior 06/03/2018-sinus rhythm 75 with no other abnormalities-05/31/16-sinus rhythm, 68, no other abnormality is. 06/01/14 - Sinus rhythm,70, no abnormalities    ASSESSMENT AND PLAN:   Paroxysmal atrial fibrillation  - He has not been having any atrial fibrillation. Obviously if this returns, he needs anticoagulation. We have discussed.  Still in sinus rhythm.  No palpitations.  [facility]. Ola Spurr at [facility] performed ablation 2009 Coronary artery disease   - Continuing with Plavix, early generation stent  - No aspirin. Has had dyspepsia in the past with this.  Overall doing quite well.  Hyperlipidemia  -I am going to change him over to Crestor 10 mg a day prior LDL has been as low as 55.  Currently 80.  He was taking simvastatin previously every other day.  Essential hypertension  - Stopped HCTZ because of small toe pain previously. He says after stopping the medication it felt better.  - On losartan 25 mg once a day.  Fairly well controlled.  Sometimes he is in the 150 range.  We will monitor.  Still exercising.     1 Year follow up.  Signed, Candee Furbish, MD [facility]  07/28/2019 3:55 PM

## 2019-08-02 ENCOUNTER — Other Ambulatory Visit: Payer: Self-pay | Admitting: Cardiology

## 2019-09-21 ENCOUNTER — Other Ambulatory Visit: Payer: Self-pay | Admitting: Cardiology

## 2019-10-10 ENCOUNTER — Other Ambulatory Visit: Payer: Self-pay | Admitting: Cardiology

## 2019-10-16 ENCOUNTER — Ambulatory Visit: Payer: Federal, State, Local not specified - PPO | Attending: Internal Medicine

## 2019-10-16 DIAGNOSIS — Z23 Encounter for immunization: Secondary | ICD-10-CM | POA: Diagnosis present

## 2019-10-16 NOTE — Progress Notes (Signed)
   Covid-19 Vaccination Clinic  Name:  Willie Gonzalez    MRN: [id number] DOB: December 12, 1940  10/16/2019  Willie Gonzalez was observed post Covid-19 immunization for 15 minutes without incidence. He was provided with Vaccine Information Sheet and instruction to access the V-Safe system.   Willie Gonzalez was instructed to call 911 with any severe reactions post vaccine: Marland Kitchen Difficulty breathing  . Swelling of your face and throat  . A fast heartbeat  . A bad rash all over your body  . Dizziness and weakness    Immunizations Administered    Name Date Dose VIS Date Route   Pfizer COVID-19 Vaccine 10/16/2019 10:10 AM 0.3 mL 09/11/2019 Intramuscular   Manufacturer: [employer], [employer]   Lot: [id number]   NDC: [address]-1

## 2019-10-29 ENCOUNTER — Other Ambulatory Visit: Payer: Federal, State, Local not specified - PPO | Admitting: *Deleted

## 2019-10-29 ENCOUNTER — Telehealth: Payer: Self-pay

## 2019-10-29 ENCOUNTER — Other Ambulatory Visit: Payer: Self-pay

## 2019-10-29 DIAGNOSIS — E785 Hyperlipidemia, unspecified: Secondary | ICD-10-CM

## 2019-10-29 DIAGNOSIS — Z79899 Other long term (current) drug therapy: Secondary | ICD-10-CM

## 2019-10-29 DIAGNOSIS — I10 Essential (primary) hypertension: Secondary | ICD-10-CM

## 2019-10-29 LAB — ALT: ALT: 26 IU/L (ref 0–44)

## 2019-10-29 LAB — LIPID PANEL
Chol/HDL Ratio: 2.1 ratio (ref 0.0–5.0)
Cholesterol, Total: 107 mg/dL (ref 100–199)
HDL: 51 mg/dL (ref >39)
LDL Chol Calc (NIH): 33 mg/dL (ref 0–99)
Triglycerides: 129 mg/dL (ref 0–149)
VLDL Cholesterol Cal: 23 mg/dL (ref 5–40)

## 2019-10-29 NOTE — Telephone Encounter (Signed)
-----   Message from Jake Bathe, MD sent at 10/29/2019  4:32 PM EST ----- LDL 33.  Excellent response to Crestor.  Liver function normal.  Continue. Donato Schultz, MD

## 2019-10-29 NOTE — Telephone Encounter (Signed)
Lpm with results 1/28

## 2019-11-05 ENCOUNTER — Ambulatory Visit: Payer: Federal, State, Local not specified - PPO | Attending: Internal Medicine

## 2019-11-05 DIAGNOSIS — Z23 Encounter for immunization: Secondary | ICD-10-CM

## 2019-11-05 NOTE — Progress Notes (Signed)
   Covid-19 Vaccination Clinic  Name:  TERIQUE KAWABATA    MRN: [id number] DOB: Nov 27, 1940  11/05/2019  Mr. Netto was observed post Covid-19 immunization for 15 minutes without incidence. He was provided with Vaccine Information Sheet and instruction to access the V-Safe system.   Mr. Mancil was instructed to call 911 with any severe reactions post vaccine: Marland Kitchen Difficulty breathing  . Swelling of your face and throat  . A fast heartbeat  . A bad rash all over your body  . Dizziness and weakness    Immunizations Administered    Name Date Dose VIS Date Route   Pfizer COVID-19 Vaccine 11/05/2019 12:21 PM 0.3 mL 09/11/2019 Intramuscular   Manufacturer: [employer], [employer]   Lot: [id number]   NDC: [address]-0

## 2020-01-28 ENCOUNTER — Other Ambulatory Visit: Payer: Self-pay | Admitting: Cardiology

## 2020-03-08 ENCOUNTER — Telehealth: Payer: Self-pay

## 2020-03-08 NOTE — Telephone Encounter (Signed)
Lpm with Dr Anne Fu' recommendation. 6/8

## 2020-03-08 NOTE — Telephone Encounter (Signed)
I would recommend ER evaluation if you are worried about a mini stroke.  Could confer with Dr. Kevan Ny as well.  Donato Schultz, MD

## 2020-06-14 ENCOUNTER — Telehealth: Payer: Self-pay | Admitting: Cardiology

## 2020-06-14 DIAGNOSIS — E785 Hyperlipidemia, unspecified: Secondary | ICD-10-CM

## 2020-06-14 DIAGNOSIS — I10 Essential (primary) hypertension: Secondary | ICD-10-CM

## 2020-06-14 DIAGNOSIS — I480 Paroxysmal atrial fibrillation: Secondary | ICD-10-CM

## 2020-06-14 NOTE — Telephone Encounter (Signed)
Patient requesting a lipid panel for him to have done prior to his November follow up with Dr. Anne Fu.

## 2020-06-14 NOTE — Telephone Encounter (Signed)
Will route to Dr. Anne Fu to see if ok to draw lipid panel and see if other labs are needed?

## 2020-06-15 NOTE — Telephone Encounter (Signed)
Lipids, ALT, CBC, BMET  Thanks  Donato Schultz, MD

## 2020-06-15 NOTE — Telephone Encounter (Signed)
Called patient with lab work recommendations from Dr. Anne Fu. Patient will come in on 08/04/20 for fasting lab work. Orders have been placed and patient has been scheduled.

## 2020-07-20 ENCOUNTER — Other Ambulatory Visit: Payer: Self-pay | Admitting: Cardiology

## 2020-08-04 ENCOUNTER — Other Ambulatory Visit: Payer: Federal, State, Local not specified - PPO

## 2020-08-04 ENCOUNTER — Other Ambulatory Visit: Payer: Self-pay

## 2020-08-04 DIAGNOSIS — I10 Essential (primary) hypertension: Secondary | ICD-10-CM

## 2020-08-04 DIAGNOSIS — I480 Paroxysmal atrial fibrillation: Secondary | ICD-10-CM

## 2020-08-04 DIAGNOSIS — E785 Hyperlipidemia, unspecified: Secondary | ICD-10-CM

## 2020-08-04 LAB — BASIC METABOLIC PANEL
BUN/Creatinine Ratio: 12 (ref 10–24)
BUN: 14 mg/dL (ref 8–27)
CO2: 25 mmol/L (ref 20–29)
Calcium: 9.6 mg/dL (ref 8.6–10.2)
Chloride: 105 mmol/L (ref 96–106)
Creatinine, Ser: 1.16 mg/dL (ref 0.76–1.27)
GFR calc Af Amer: 69 mL/min/{1.73_m2} (ref >59)
GFR calc non Af Amer: 60 mL/min/{1.73_m2} (ref >59)
Glucose: 106 mg/dL — ABNORMAL HIGH (ref 65–99)
Potassium: 4.9 mmol/L (ref 3.5–5.2)
Sodium: 143 mmol/L (ref 134–144)

## 2020-08-04 LAB — LIPID PANEL
Chol/HDL Ratio: 2.3 ratio (ref 0.0–5.0)
Cholesterol, Total: 115 mg/dL (ref 100–199)
HDL: 51 mg/dL (ref >39)
LDL Chol Calc (NIH): 43 mg/dL (ref 0–99)
Triglycerides: 114 mg/dL (ref 0–149)
VLDL Cholesterol Cal: 21 mg/dL (ref 5–40)

## 2020-08-04 LAB — CBC WITH DIFFERENTIAL/PLATELET
Basophils Absolute: 0.1 10*3/uL (ref 0.0–0.2)
Basos: 1 %
EOS (ABSOLUTE): 0.1 10*3/uL (ref 0.0–0.4)
Eos: 1 %
Hematocrit: 46 % (ref 37.5–51.0)
Hemoglobin: 15.5 g/dL (ref 13.0–17.7)
Immature Grans (Abs): 0 10*3/uL (ref 0.0–0.1)
Immature Granulocytes: 0 %
Lymphocytes Absolute: 2.8 10*3/uL (ref 0.7–3.1)
Lymphs: 44 %
MCH: 29.1 pg (ref 26.6–33.0)
MCHC: 33.7 g/dL (ref 31.5–35.7)
MCV: 87 fL (ref 79–97)
Monocytes Absolute: 0.5 10*3/uL (ref 0.1–0.9)
Monocytes: 8 %
Neutrophils Absolute: 2.9 10*3/uL (ref 1.4–7.0)
Neutrophils: 46 %
Platelets: 210 10*3/uL (ref 150–450)
RBC: 5.32 x10E6/uL (ref 4.14–5.80)
RDW: 12.1 % (ref 11.6–15.4)
WBC: 6.3 10*3/uL (ref 3.4–10.8)

## 2020-08-04 LAB — ALT: ALT: 14 IU/L (ref 0–44)

## 2020-08-11 ENCOUNTER — Encounter: Payer: Self-pay | Admitting: Cardiology

## 2020-08-11 ENCOUNTER — Other Ambulatory Visit: Payer: Self-pay

## 2020-08-11 ENCOUNTER — Ambulatory Visit: Payer: Federal, State, Local not specified - PPO | Admitting: Cardiology

## 2020-08-11 VITALS — BP 140/78 | HR 66 | Ht 68.0 in | Wt 159.0 lb

## 2020-08-11 DIAGNOSIS — E785 Hyperlipidemia, unspecified: Secondary | ICD-10-CM

## 2020-08-11 DIAGNOSIS — I10 Essential (primary) hypertension: Secondary | ICD-10-CM | POA: Diagnosis not present

## 2020-08-11 DIAGNOSIS — I2510 Atherosclerotic heart disease of native coronary artery without angina pectoris: Secondary | ICD-10-CM

## 2020-08-11 DIAGNOSIS — I480 Paroxysmal atrial fibrillation: Secondary | ICD-10-CM

## 2020-08-11 MED ORDER — LOSARTAN POTASSIUM 25 MG PO TABS: 25.0000 mg | ORAL_TABLET | Freq: Every day | ORAL | 3 refills | Status: DC

## 2020-08-11 MED ORDER — CLOPIDOGREL BISULFATE 75 MG PO TABS
75.0000 mg | ORAL_TABLET | Freq: Every day | ORAL | 2 refills | Status: DC
Start: 2020-08-11 — End: 2020-09-19

## 2020-08-11 NOTE — Progress Notes (Signed)
Cardiology Office Note:    Date:  08/11/2020   ID:  Mckale, Haffey 1940/11/08, MRN [id number]  PCP:  Marden Noble, MD  [facility] Cardiologist:  Donato Schultz, MD  [facility] Electrophysiologist:  None   Referring MD: Marden Noble, MD     History of Present Illness:    Willie Gonzalez is a 79 y.o. male here for follow-up of coronary artery disease.  Cardiac catheterization in 2006 resulted in mid LAD/diagonal disease with drug-eluting stent placement.  Paroxysmal atrial fibrillation post ablation in 2009 with no recurrence.  Enjoys riding his bike.   Taking his medications without difficulty.  Denies any fevers chills nausea vomiting syncope bleeding.  In 2020 we changed statin over to Crestor.  Past Medical History:  Diagnosis Date  . Coronary atherosclerosis of unspecified type of vessel, native or graft    coronary stent- Dr. Anne Fu follows LOV [phone] Epic  . DJD (degenerative joint disease)   . GERD (gastroesophageal reflux disease)   . HTN (hypertension)   . Hypercholesteremia   . Paroxysmal atrial fibrillation (HCC) 09/07/2013   Ablation in 2009 at [facility], Dr. Sampson Goon   . 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 CARDIAC CATHETERIZATION    . COLONOSCOPY WITH PROPOFOL N/A 07/03/2016   Procedure: COLONOSCOPY WITH PROPOFOL;  Surgeon: Charolett Bumpers, MD;  Location: [facility];  Service: Endoscopy;  Laterality: N/A;  . lap choley    . ROTATOR CUFF REPAIR Right     Current Medications: Current Meds  Medication Sig  . clopidogrel (PLAVIX) 75 MG tablet Take 1 tablet (75 mg total) by mouth daily.  Marland Kitchen co-enzyme Q-10 30 MG capsule Take 30 mg by mouth daily.  . lansoprazole (PREVACID) 15 MG capsule Take 15 mg by mouth daily at 12 noon.  Marland Kitchen losartan (COZAAR) 25 MG tablet Take 1 tablet (25 mg total) by mouth daily.  . Misc Natural Products (BETA-SITOSTEROL PLANT STEROLS PO) Take 325 mg by  mouth daily.  . rosuvastatin (CRESTOR) 10 MG tablet TAKE ONE TABLET BY MOUTH DAILY  . [DISCONTINUED] clopidogrel (PLAVIX) 75 MG tablet TAKE 1 TABLET BY MOUTH EVERY DAY  . [DISCONTINUED] losartan (COZAAR) 25 MG tablet TAKE 1 TABLET BY MOUTH EVERY DAY     Allergies:   Aspirin   Social History   Socioeconomic History  . Marital status: Widowed    Spouse name: Not on file  . Number of children: Not on file  . Years of education: Not on file  . Highest education level: Not on file  Occupational History  . Not on file  Tobacco Use  . Smoking status: Former Smoker    Quit date: 09/07/1957    Years since quitting: 62.9  . Smokeless tobacco: Never Used  Vaping Use  . Vaping Use: Never used  Substance and Sexual Activity  . Alcohol use: No  . Drug use: No  . Sexual activity: Not on file  Other Topics Concern  . Not on file  Social History Narrative  . Not on file   Social Determinants of Health   Financial Resource Strain:   . Difficulty of Paying Living Expenses: Not on file  Food Insecurity:   . Worried About Programme researcher, broadcasting/film/video in the Last Year: Not on file  . Ran Out of Food in the Last Year: Not on file  Transportation Needs:   . Lack of Transportation (Medical): Not on file  .  Lack of Transportation (Non-Medical): Not on file  Physical Activity:   . Days of Exercise per Week: Not on file  . Minutes of Exercise per Session: Not on file  Stress:   . Feeling of Stress : Not on file  Social Connections:   . Frequency of Communication with Friends and Family: Not on file  . Frequency of Social Gatherings with Friends and Family: Not on file  . Attends Religious Services: Not on file  . Active Member of Clubs or Organizations: Not on file  . Attends Banker Meetings: Not on file  . Marital Status: Not on file     Family History: The patient's family history includes Alcoholism in his father; Emphysema in his brother and father; Heart disease in his sister;  Lung cancer in his sister; Osteoarthritis in his sister; Other in his mother; Pneumonia in his brother and father.  ROS:   Please see the history of present illness.    No fevers chills nausea vomiting syncope.  All other systems reviewed and are negative.  EKGs/Labs/Other Studies Reviewed:    The following studies were reviewed today:   EKG:  EKG is  ordered today.  The ekg ordered today demonstrates sinus rhythm 66 with no other abnormalities.  Recent Labs: 08/04/2020: ALT 14; BUN 14; Creatinine, Ser 1.16; Hemoglobin 15.5; Platelets 210; Potassium 4.9; Sodium 143  Recent Lipid Panel    Component Value Date/Time   CHOL 115 08/04/2020 0834   TRIG 114 08/04/2020 0834   HDL 51 08/04/2020 0834   CHOLHDL 2.3 08/04/2020 0834   CHOLHDL 2.8 07/21/2019 1146   VLDL 20 07/21/2019 1146   LDLCALC 43 08/04/2020 0834     Risk Assessment/Calculations:       Physical Exam:    VS:  BP 140/78   Pulse 66   Ht 5\' 8"  (1.727 m)   Wt 159 lb (72.1 kg)   SpO2 97%   BMI 24.18 kg/m     Wt Readings from Last 3 Encounters:  08/11/20 159 lb (72.1 kg)  07/28/19 160 lb (72.6 kg)  06/03/18 165 lb 3.2 oz (74.9 kg)     GEN:  Well nourished, well developed in no acute distress HEENT: Normal NECK: No JVD; No carotid bruits LYMPHATICS: No lymphadenopathy CARDIAC: RRR, no murmurs, rubs, gallops RESPIRATORY:  Clear to auscultation without rales, wheezing or rhonchi  ABDOMEN: Soft, non-tender, non-distended MUSCULOSKELETAL:  No edema; No deformity  SKIN: Warm and dry NEUROLOGIC:  Alert and oriented x 3 PSYCHIATRIC:  Normal affect   ASSESSMENT:    1. Paroxysmal atrial fibrillation (HCC)   2. Coronary artery disease involving native coronary artery of native heart without angina pectoris   3. Hyperlipidemia, unspecified hyperlipidemia type   4. Essential hypertension    PLAN:    In order of problems listed above:  Paroxysmal atrial fibrillation -Post ablation in 2009 -Doing well without  any return of atrial fibrillation. -Of course if atrial fibrillation were to return, he would need to be on anticoagulation.  Doing well sinus rhythm. -Ablation was [facility] at [facility] with [facility].  Coronary artery disease -Prior LAD stent early generation continuing with Plavix monotherapy to help prevent late in-stent restenosis.  Overall continues to do quite well. -No significant anginal symptoms. -Previously with aspirin, had dyspepsia.  Aortic atherosclerosis -Seen on CT scan of abdomen 04/25/2018 personally reviewed.  Continue with statin therapy.  Plavix.  Hypertension controlled.  Hyperlipidemia -Previously changing to Crestor 10 mg a day as  he was taking simvastatin previously every other day.  Ultimately goal dose is 20 mg, high intensity dose however his LDL currently is ranging between 33 and 43.  Excellent.  Essential hypertension -On angiotensin receptor blocker, losartan at low-dose.  Doing well.  Exercising.  Occasionally at home he may be in the 150 range but this is rare.  Continue to monitor. -Previously tried HCTZ but he stopped this because of toe pain previously.      Shared Decision Making/Informed Consent      Medication Adjustments/Labs and Tests Ordered: Current medicines are reviewed at length with the patient today.  Concerns regarding medicines are outlined above.  Orders Placed This Encounter  Procedures  . EKG 12-Lead   Meds ordered this encounter  Medications  . clopidogrel (PLAVIX) 75 MG tablet    Sig: Take 1 tablet (75 mg total) by mouth daily.    Dispense:  90 tablet    Refill:  2  . losartan (COZAAR) 25 MG tablet    Sig: Take 1 tablet (25 mg total) by mouth daily.    Dispense:  90 tablet    Refill:  3    Patient Instructions  Medication Instructions:  The current medical regimen is effective;  continue present plan and medications.  *If you need a refill on your cardiac medications before your next appointment,  please call your pharmacy*  Follow-Up: At [facility], you and your health needs are our priority.  As part of our continuing mission to provide you with exceptional heart care, we have created designated Provider Care Teams.  These Care Teams include your primary Cardiologist (physician) and Advanced Practice Providers (APPs -  Physician Assistants and Nurse Practitioners) who all work together to provide you with the care you need, when you need it.  We recommend signing up for the patient portal called "MyChart".  Sign up information is provided on this After Visit Summary.  MyChart is used to connect with patients for Virtual Visits (Telemedicine).  Patients are able to view lab/test results, encounter notes, upcoming appointments, etc.  Non-urgent messages can be sent to your provider as well.   To learn more about what you can do with MyChart, go to [email or web address].    Your next appointment:   12 month(s)  The format for your next appointment:   In Person  Provider:   Donato Schultz, MD   Thank you for choosing [facility]!!        Signed, Donato Schultz, MD  08/11/2020 10:08 AM     [facility]

## 2020-08-11 NOTE — Patient Instructions (Signed)
Medication Instructions:  The current medical regimen is effective;  continue present plan and medications.  *If you need a refill on your cardiac medications before your next appointment, please call your pharmacy*  Follow-Up: At [facility], you and your health needs are our priority.  As part of our continuing mission to provide you with exceptional heart care, we have created designated Provider Care Teams.  These Care Teams include your primary Cardiologist (physician) and Advanced Practice Providers (APPs -  Physician Assistants and Nurse Practitioners) who all work together to provide you with the care you need, when you need it.  We recommend signing up for the patient portal called "MyChart".  Sign up information is provided on this After Visit Summary.  MyChart is used to connect with patients for Virtual Visits (Telemedicine).  Patients are able to view lab/test results, encounter notes, upcoming appointments, etc.  Non-urgent messages can be sent to your provider as well.   To learn more about what you can do with MyChart, go to [email or web address].    Your next appointment:   12 month(s)  The format for your next appointment:   In Person  Provider:   Mark Skains, MD   Thank you for choosing [facility]!!      

## 2020-08-24 ENCOUNTER — Other Ambulatory Visit: Payer: Self-pay | Admitting: Cardiology

## 2020-09-18 ENCOUNTER — Other Ambulatory Visit: Payer: Self-pay | Admitting: Cardiology

## 2020-10-24 ENCOUNTER — Other Ambulatory Visit: Payer: Federal, State, Local not specified - PPO

## 2020-10-24 DIAGNOSIS — Z20822 Contact with and (suspected) exposure to covid-19: Secondary | ICD-10-CM

## 2020-10-25 LAB — SARS-COV-2, NAA 2 DAY TAT

## 2020-10-25 LAB — NOVEL CORONAVIRUS, NAA: SARS-CoV-2, NAA: DETECTED — AB

## 2020-11-21 ENCOUNTER — Ambulatory Visit: Payer: Federal, State, Local not specified - PPO | Admitting: Cardiology

## 2020-11-21 ENCOUNTER — Other Ambulatory Visit: Payer: Self-pay

## 2020-11-21 ENCOUNTER — Encounter: Payer: Self-pay | Admitting: Cardiology

## 2020-11-21 VITALS — BP 116/70 | HR 82 | Ht 68.0 in | Wt 160.0 lb

## 2020-11-21 DIAGNOSIS — R252 Cramp and spasm: Secondary | ICD-10-CM

## 2020-11-21 DIAGNOSIS — I480 Paroxysmal atrial fibrillation: Secondary | ICD-10-CM

## 2020-11-21 DIAGNOSIS — I2510 Atherosclerotic heart disease of native coronary artery without angina pectoris: Secondary | ICD-10-CM

## 2020-11-21 MED ORDER — MAG-OXIDE 200 MG PO TABS: 200.0000 mg | ORAL_TABLET | Freq: Every day | ORAL | 3 refills | Status: AC

## 2020-11-21 NOTE — Progress Notes (Signed)
Cardiology Office Note:    Date:  11/21/2020   ID:  Willie, Gonzalez [birth year], MRN [id number]  PCP:  Marden Noble, MD   [facility]  Cardiologist:  Donato Schultz, MD  Advanced Practice Provider:  No care team member to display Electrophysiologist:  None       Referring MD: Marden Noble, MD     History of Present Illness:    Willie Gonzalez is a 80 y.o. male here for the evaluation of leg cramps.  He was wondering whether this is a vitamin deficiency or perhaps PAD.  Cardiac catheterization in 2006 resulted in mid LAD/diagonal disease with drug-eluting stent placement.  Paroxysmal atrial fibrillation post ablation in 2009 with no recurrence.  Enjoys riding his bike.   Taking his medications without difficulty.  Denies any fevers chills nausea vomiting syncope bleeding.  In 2020 we changed statin over to Crestor.  Been noticing significant leg cramping.  See below for details.  Overall not having any chest pain fever chills nausea vomiting syncope.   Past Medical History:  Diagnosis Date  . Coronary atherosclerosis of unspecified type of vessel, native or graft    coronary stent- Dr. Anne Fu follows LOV [phone] Epic  . DJD (degenerative joint disease)   . GERD (gastroesophageal reflux disease)   . HTN (hypertension)   . Hypercholesteremia   . Paroxysmal atrial fibrillation (HCC) 09/07/2013   Ablation in 2009 at [facility], Dr. Sampson Goon   . 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 CARDIAC CATHETERIZATION    . COLONOSCOPY WITH PROPOFOL N/A 07/03/2016   Procedure: COLONOSCOPY WITH PROPOFOL;  Surgeon: Charolett Bumpers, MD;  Location: [facility];  Service: Endoscopy;  Laterality: N/A;  . lap choley    . ROTATOR CUFF REPAIR Right     Current Medications: Current Meds  Medication Sig  . clopidogrel (PLAVIX) 75 MG tablet TAKE 1 TABLET BY MOUTH EVERY DAY  . co-enzyme Q-10 30  MG capsule Take 30 mg by mouth daily.  . lansoprazole (PREVACID) 15 MG capsule Take 15 mg by mouth daily at 12 noon.  Marland Kitchen losartan (COZAAR) 25 MG tablet TAKE 1 TABLET BY MOUTH EVERY DAY  . Magnesium Oxide (MAG-OXIDE) 200 MG TABS Take 1 tablet (200 mg total) by mouth daily.  . Misc Natural Products (BETA-SITOSTEROL PLANT STEROLS PO) Take 325 mg by mouth daily.  . rosuvastatin (CRESTOR) 10 MG tablet TAKE ONE TABLET BY MOUTH DAILY     Allergies:   Aspirin   Social History   Socioeconomic History  . Marital status: Widowed    Spouse name: Not on file  . Number of children: Not on file  . Years of education: Not on file  . Highest education level: Not on file  Occupational History  . Not on file  Tobacco Use  . Smoking status: Former Smoker    Quit date: 09/07/1957    Years since quitting: 63.2  . Smokeless tobacco: Never Used  Vaping Use  . Vaping Use: Never used  Substance and Sexual Activity  . Alcohol use: No  . Drug use: No  . Sexual activity: Not on file  Other Topics Concern  . Not on file  Social History Narrative  . Not on file   Social Determinants of Health   Financial Resource Strain: Not on file  Food Insecurity: Not on file  Transportation Needs: Not on file  Physical Activity: Not on file  Stress: Not on file  Social Connections: Not on file     Family History: The patient's family history includes Alcoholism in his father; Emphysema in his brother and father; Heart disease in his sister; Lung cancer in his sister; Osteoarthritis in his sister; Other in his mother; Pneumonia in his brother and father.  ROS:   Please see the history of present illness.     All other systems reviewed and are negative.  EKGs/Labs/Other Studies Reviewed:    Recent Labs: 08/04/2020: ALT 14; BUN 14; Creatinine, Ser 1.16; Hemoglobin 15.5; Platelets 210; Potassium 4.9; Sodium 143  Recent Lipid Panel    Component Value Date/Time   CHOL 115 08/04/2020 0834   TRIG 114  08/04/2020 0834   HDL 51 08/04/2020 0834   CHOLHDL 2.3 08/04/2020 0834   CHOLHDL 2.8 07/21/2019 1146   VLDL 20 07/21/2019 1146   LDLCALC 43 08/04/2020 0834     Risk Assessment/Calculations:      Physical Exam:    VS:  BP 116/70 (BP Location: Left Arm, Patient Position: Sitting, Cuff Size: Normal)   Pulse 82   Ht 5\' 8"  (1.727 m)   Wt 160 lb (72.6 kg)   SpO2 99%   BMI 24.33 kg/m     Wt Readings from Last 3 Encounters:  11/21/20 160 lb (72.6 kg)  08/11/20 159 lb (72.1 kg)  07/28/19 160 lb (72.6 kg)     GEN:  Well nourished, well developed in no acute distress HEENT: Normal NECK: No JVD; No carotid bruits LYMPHATICS: No lymphadenopathy CARDIAC: RRR, no murmurs, rubs, gallops RESPIRATORY:  Clear to auscultation without rales, wheezing or rhonchi  ABDOMEN: Soft, non-tender, non-distended MUSCULOSKELETAL:  No edema; No deformity  SKIN: Warm and dry NEUROLOGIC:  Alert and oriented x 3 PSYCHIATRIC:  Normal affect   ASSESSMENT:    1. Leg cramping   2. Paroxysmal atrial fibrillation (HCC)   3. Coronary artery disease involving native coronary artery of native heart without angina pectoris    PLAN:    In order of problems listed above:  Severe leg cramping -Noticed it particularly after having the omicron variant Covid infection.  He went out for a 18 mile bike ride about 20 days after the infection and then the night after had severe cramping. -Has calf bilaterally and inner right thigh.  Sometimes they will happen when he wakes up in the morning and stretches. -I will check lower extremity Dopplers to make sure that he does not have any evidence of peripheral vascular disease given his concomitant coronary artery disease. -Magnesium supplementation 200 mg Mag-Ox per day.  Continue with banana.  Check basic metabolic profile with Mag  Coronary artery disease -Stable no anginal symptoms.  Paroxysmal atrial fibrillation -Post ablation 2009 no return.  Aortic  atherosclerosis -Plavix, hypertension control.  Statin.      Medication Adjustments/Labs and Tests Ordered: Current medicines are reviewed at length with the patient today.  Concerns regarding medicines are outlined above.  Orders Placed This Encounter  Procedures  . Basic metabolic panel  . Magnesium  . VAS 2010 LOWER EXTREMITY ARTERIAL DUPLEX   Meds ordered this encounter  Medications  . Magnesium Oxide (MAG-OXIDE) 200 MG TABS    Sig: Take 1 tablet (200 mg total) by mouth daily.    Dispense:  90 tablet    Refill:  3    Patient Instructions  Medication Instructions:  Your physician has recommended you make the following change in your medication:   Begin taking Magnesium  Oxide 200mg  daily *If you need a refill on your cardiac medications before your next appointment, please call your pharmacy*   Lab Work: BMET and Mg today  If you have labs (blood work) drawn today and your tests are completely normal, you will receive your results only by: MyChart Message (if you have MyChart) OR . A paper copy in the mail If you have any lab test that is abnormal or we need to change your treatment, we will call you to review the results.   Testing/Procedures: Your physician has requested that you have a lower or upper extremity arterial duplex. This test is an ultrasound of the arteries in the legs or arms. It looks at arterial blood flow in the legs and arms. Allow one hour for Lower and Upper Arterial scans. There are no restrictions or special instructions   Follow-Up: At [facility], you and your health needs are our priority.  As part of our continuing mission to provide you with exceptional heart care, we have created designated Provider Care Teams.  These Care Teams include your primary Cardiologist (physician) and Advanced Practice Providers (APPs -  Physician Assistants and Nurse Practitioners) who all work together to provide you with the care you need, when you need  it.  We recommend signing up for the patient portal called "MyChart".  Sign up information is provided on this After Visit Summary.  MyChart is used to connect with patients for Virtual Visits (Telemedicine).  Patients are able to view lab/test results, encounter notes, upcoming appointments, etc.  Non-urgent messages can be sent to your provider as well.   To learn more about what you can do with MyChart, go to [facility].    Your next appointment:   6 month(s)  The format for your next appointment:   In Person  Provider:   [email or web address]        Signed, Anne Fu, MD  11/21/2020 5:53 PM    [facility]

## 2020-11-21 NOTE — Patient Instructions (Signed)
Medication Instructions:  Your physician has recommended you make the following change in your medication:   Begin taking Magnesium Oxide 200mg  daily *If you need a refill on your cardiac medications before your next appointment, please call your pharmacy*   Lab Work: BMET and Mg today  If you have labs (blood work) drawn today and your tests are completely normal, you will receive your results only by: MyChart Message (if you have MyChart) OR . A paper copy in the mail If you have any lab test that is abnormal or we need to change your treatment, we will call you to review the results.   Testing/Procedures: Your physician has requested that you have a lower or upper extremity arterial duplex. This test is an ultrasound of the arteries in the legs or arms. It looks at arterial blood flow in the legs and arms. Allow one hour for Lower and Upper Arterial scans. There are no restrictions or special instructions   Follow-Up: At [facility], you and your health needs are our priority.  As part of our continuing mission to provide you with exceptional heart care, we have created designated Provider Care Teams.  These Care Teams include your primary Cardiologist (physician) and Advanced Practice Providers (APPs -  Physician Assistants and Nurse Practitioners) who all work together to provide you with the care you need, when you need it.  We recommend signing up for the patient portal called "MyChart".  Sign up information is provided on this After Visit Summary.  MyChart is used to connect with patients for Virtual Visits (Telemedicine).  Patients are able to view lab/test results, encounter notes, upcoming appointments, etc.  Non-urgent messages can be sent to your provider as well.   To learn more about what you can do with MyChart, go to [facility].    Your next appointment:   6 month(s)  The format for your next appointment:   In Person  Provider:   [email or web address]

## 2020-11-22 ENCOUNTER — Other Ambulatory Visit: Payer: Federal, State, Local not specified - PPO | Admitting: *Deleted

## 2020-11-22 DIAGNOSIS — R252 Cramp and spasm: Secondary | ICD-10-CM

## 2020-11-22 DIAGNOSIS — I480 Paroxysmal atrial fibrillation: Secondary | ICD-10-CM

## 2020-11-22 DIAGNOSIS — I2510 Atherosclerotic heart disease of native coronary artery without angina pectoris: Secondary | ICD-10-CM

## 2020-11-22 LAB — BASIC METABOLIC PANEL
BUN/Creatinine Ratio: 13 (ref 10–24)
BUN: 15 mg/dL (ref 8–27)
CO2: 23 mmol/L (ref 20–29)
Calcium: 9.3 mg/dL (ref 8.6–10.2)
Chloride: 105 mmol/L (ref 96–106)
Creatinine, Ser: 1.12 mg/dL (ref 0.76–1.27)
GFR calc Af Amer: 72 mL/min/{1.73_m2} (ref >59)
GFR calc non Af Amer: 63 mL/min/{1.73_m2} (ref >59)
Glucose: 119 mg/dL — ABNORMAL HIGH (ref 65–99)
Potassium: 4.1 mmol/L (ref 3.5–5.2)
Sodium: 141 mmol/L (ref 134–144)

## 2020-12-01 ENCOUNTER — Other Ambulatory Visit: Payer: Self-pay | Admitting: Cardiology

## 2020-12-01 DIAGNOSIS — R252 Cramp and spasm: Secondary | ICD-10-CM

## 2020-12-08 ENCOUNTER — Inpatient Hospital Stay (HOSPITAL_COMMUNITY): Admission: RE | Admit: 2020-12-08 | Payer: Federal, State, Local not specified - PPO | Source: Ambulatory Visit

## 2020-12-09 NOTE — Telephone Encounter (Signed)
LEA are scheduled 12/14/20.

## 2020-12-14 ENCOUNTER — Other Ambulatory Visit: Payer: Self-pay

## 2020-12-14 ENCOUNTER — Ambulatory Visit (HOSPITAL_COMMUNITY)
Admission: RE | Admit: 2020-12-14 | Discharge: 2020-12-14 | Disposition: A | Discharge status: Home / Self Care | Payer: Federal, State, Local not specified - PPO | Source: Ambulatory Visit | Attending: Cardiovascular Disease | Admitting: Cardiovascular Disease

## 2020-12-14 DIAGNOSIS — R252 Cramp and spasm: Secondary | ICD-10-CM | POA: Diagnosis not present

## 2021-02-10 MED ORDER — METOPROLOL TARTRATE 25 MG PO TABS: ORAL_TABLET | ORAL | 11 refills | Status: DC

## 2021-04-05 ENCOUNTER — Other Ambulatory Visit: Payer: Self-pay

## 2021-04-05 ENCOUNTER — Emergency Department (HOSPITAL_BASED_OUTPATIENT_CLINIC_OR_DEPARTMENT_OTHER)
Admission: EM | Admit: 2021-04-05 | Discharge: 2021-04-05 | Disposition: A | Discharge status: Home / Self Care | Payer: Federal, State, Local not specified - PPO | Attending: Emergency Medicine | Admitting: Emergency Medicine

## 2021-04-05 ENCOUNTER — Emergency Department (HOSPITAL_BASED_OUTPATIENT_CLINIC_OR_DEPARTMENT_OTHER): Payer: Federal, State, Local not specified - PPO

## 2021-04-05 ENCOUNTER — Encounter (HOSPITAL_BASED_OUTPATIENT_CLINIC_OR_DEPARTMENT_OTHER): Payer: Self-pay | Admitting: Emergency Medicine

## 2021-04-05 DIAGNOSIS — I10 Essential (primary) hypertension: Secondary | ICD-10-CM | POA: Insufficient documentation

## 2021-04-05 DIAGNOSIS — Z87891 Personal history of nicotine dependence: Secondary | ICD-10-CM | POA: Insufficient documentation

## 2021-04-05 DIAGNOSIS — Z7902 Long term (current) use of antithrombotics/antiplatelets: Secondary | ICD-10-CM | POA: Diagnosis not present

## 2021-04-05 DIAGNOSIS — R519 Headache, unspecified: Secondary | ICD-10-CM | POA: Diagnosis not present

## 2021-04-05 DIAGNOSIS — Z79899 Other long term (current) drug therapy: Secondary | ICD-10-CM | POA: Diagnosis not present

## 2021-04-05 LAB — CBC WITH DIFFERENTIAL/PLATELET
Abs Immature Granulocytes: 0.02 10*3/uL (ref 0.00–0.07)
Basophils Absolute: 0 10*3/uL (ref 0.0–0.1)
Basophils Relative: 1 %
Eosinophils Absolute: 0 10*3/uL (ref 0.0–0.5)
Eosinophils Relative: 0 %
HCT: 44.2 % (ref 39.0–52.0)
Hemoglobin: 15.3 g/dL (ref 13.0–17.0)
Immature Granulocytes: 0 %
Lymphocytes Relative: 28 %
Lymphs Abs: 2 10*3/uL (ref 0.7–4.0)
MCH: 29.9 pg (ref 26.0–34.0)
MCHC: 34.6 g/dL (ref 30.0–36.0)
MCV: 86.5 fL (ref 80.0–100.0)
Monocytes Absolute: 0.5 10*3/uL (ref 0.1–1.0)
Monocytes Relative: 7 %
Neutro Abs: 4.6 10*3/uL (ref 1.7–7.7)
Neutrophils Relative %: 64 %
Platelets: 198 10*3/uL (ref 150–400)
RBC: 5.11 MIL/uL (ref 4.22–5.81)
RDW: 12.4 % (ref 11.5–15.5)
WBC: 7.1 10*3/uL (ref 4.0–10.5)
nRBC: 0 % (ref 0.0–0.2)

## 2021-04-05 LAB — BASIC METABOLIC PANEL
Anion gap: 8 (ref 5–15)
BUN: 18 mg/dL (ref 8–23)
CO2: 27 mmol/L (ref 22–32)
Calcium: 10.2 mg/dL (ref 8.9–10.3)
Chloride: 105 mmol/L (ref 98–111)
Creatinine, Ser: 1.06 mg/dL (ref 0.61–1.24)
GFR, Estimated: >60 mL/min (ref >60)
Glucose, Bld: 107 mg/dL — ABNORMAL HIGH (ref 70–99)
Potassium: 3.5 mmol/L (ref 3.5–5.1)
Sodium: 140 mmol/L (ref 135–145)

## 2021-04-05 MED ORDER — CLONIDINE HCL 0.1 MG PO TABS
0.1000 mg | ORAL_TABLET | Freq: Once | ORAL | Status: AC
  Administered 2021-04-05: 0.1 mg via ORAL
  Filled 2021-04-05: qty 1

## 2021-04-05 MED ORDER — SODIUM CHLORIDE 0.9 % IV SOLN: INTRAVENOUS | Status: DC

## 2021-04-05 MED ORDER — FENTANYL CITRATE (PF) 100 MCG/2ML IJ SOLN
25.0000 ug | Freq: Once | INTRAMUSCULAR | Status: AC
Start: 2021-04-05 — End: 2021-04-05
  Administered 2021-04-05: 25 ug via INTRAVENOUS
  Filled 2021-04-05: qty 2

## 2021-04-05 MED ORDER — METOCLOPRAMIDE HCL 5 MG/ML IJ SOLN
5.0000 mg | Freq: Once | INTRAMUSCULAR | Status: AC
  Administered 2021-04-05: 5 mg via INTRAVENOUS
  Filled 2021-04-05: qty 2

## 2021-04-05 NOTE — Discharge Instructions (Addendum)
Follow-up with your doctor this week for blood pressure management

## 2021-04-05 NOTE — ED Notes (Signed)
RN went with patient to CT

## 2021-04-05 NOTE — ED Triage Notes (Addendum)
Pt arrives to ED with c/o of severe headache since early this morning. Pt took tylenol w/o relief. Pt took home BP which was over 210 systolic. No N/V, sensitivity to light, neck pain, or fevers.

## 2021-04-05 NOTE — ED Provider Notes (Signed)
[facility] EMERGENCY DEPT Provider Note   CSN: [id number] Arrival date & time: 04/05/21  1711     History Chief Complaint  Patient presents with   Hypertension   Headache    Willie Gonzalez is a 80 y.o. male.  80 year old male presents with headache that he woke up with this morning.  Says his more of a frontal mid scalp type of discomfort.  Denies any emesis, new weakness with this.  States that he took his blood pressure and it was elevated.  No chest pain or shortness of breath.  Takes Plavix for history of stent placement.  He takes losartan for his blood pressure and states compliance with this.  He went worked out today and headache remained.  Denies any prior history of migraines      Past Medical History:  Diagnosis Date   Coronary atherosclerosis of unspecified type of vessel, native or graft    coronary stent- Dr. Anne Fu follows LOV [phone] Epic   DJD (degenerative joint disease)    GERD (gastroesophageal reflux disease)    HTN (hypertension)    Hypercholesteremia    Paroxysmal atrial fibrillation (HCC) 09/07/2013   Ablation in 2009 at [facility], Dr. Donetta Potts ulcerative (chronic) colitis(556.6)     Patient Active Problem List   Diagnosis Date Noted   Paroxysmal atrial fibrillation (HCC) 09/07/2013    Past Surgical History:  Procedure Laterality Date   CARDIAC CATHETERIZATION     COLONOSCOPY WITH PROPOFOL N/A 07/03/2016   Procedure: COLONOSCOPY WITH PROPOFOL;  Surgeon: Charolett Bumpers, MD;  Location: [facility];  Service: Endoscopy;  Laterality: N/A;   lap choley     ROTATOR CUFF REPAIR Right        Family History  Problem Relation Age of Onset   Other Mother        age 61 healthy   Alcoholism Father    Emphysema Father    Pneumonia Father    Lung cancer Sister    Emphysema Brother    Pneumonia Brother    Heart disease Sister        stent placed   Osteoarthritis Sister     Social History   Tobacco Use   Smoking  status: Former    Pack years: 0.00    Types: Cigarettes    Quit date: 09/07/1957    Years since quitting: 63.6   Smokeless tobacco: Never  Vaping Use   Vaping Use: Never used  Substance Use Topics   Alcohol use: No   Drug use: No    Home Medications Prior to Admission medications   Medication Sig Start Date End Date Taking? Authorizing Provider  clopidogrel (PLAVIX) 75 MG tablet TAKE 1 TABLET BY MOUTH EVERY DAY 09/19/20   Jake Bathe, MD  co-enzyme Q-10 30 MG capsule Take 30 mg by mouth daily.    [provider]  lansoprazole (PREVACID) 15 MG capsule Take 15 mg by mouth daily at 12 noon.    [provider]  losartan (COZAAR) 25 MG tablet TAKE 1 TABLET BY MOUTH EVERY DAY 08/24/20   Jake Bathe, MD  Magnesium Oxide (MAG-OXIDE) 200 MG TABS Take 1 tablet (200 mg total) by mouth daily. 11/21/20   Jake Bathe, MD  metoprolol tartrate (LOPRESSOR) 25 MG tablet Take 1 tablet by mouth twice a day as needed for palpitations 02/10/21   Jake Bathe, MD  Misc Natural Products (BETA-SITOSTEROL PLANT STEROLS PO) Take 325 mg by mouth daily.  [provider]  rosuvastatin (CRESTOR) 10 MG tablet TAKE ONE TABLET BY MOUTH DAILY 07/20/20   Jake Bathe, MD    Allergies    Aspirin  Review of Systems   Review of Systems  All other systems reviewed and are negative.  Physical Exam Updated Vital Signs BP (!) 186/104   Pulse 66   Temp 98.2 F (36.8 C) (Oral)   Resp 18   Ht 1.727 m (5\' 8" )   Wt 72.6 kg   SpO2 98%   BMI 24.33 kg/m   Physical Exam Vitals and nursing note reviewed.  Constitutional:      General: He is not in acute distress.    Appearance: Normal appearance. He is well-developed. He is not toxic-appearing.  HENT:     Head: Normocephalic and atraumatic.  Eyes:     General: Lids are normal.     Conjunctiva/sclera: Conjunctivae normal.     Pupils: Pupils are equal, round, and reactive to light.  Neck:     Thyroid: No thyroid mass.      Trachea: No tracheal deviation.  Cardiovascular:     Rate and Rhythm: Normal rate and regular rhythm.     Heart sounds: Normal heart sounds. No murmur heard.   No gallop.  Pulmonary:     Effort: Pulmonary effort is normal. No respiratory distress.     Breath sounds: Normal breath sounds. No stridor. No decreased breath sounds, wheezing, rhonchi or rales.  Abdominal:     General: There is no distension.     Palpations: Abdomen is soft.     Tenderness: There is no abdominal tenderness. There is no rebound.  Musculoskeletal:        General: No tenderness. Normal range of motion.     Cervical back: Normal range of motion and neck supple.  Skin:    General: Skin is warm and dry.     Findings: No abrasion or rash.  Neurological:     General: No focal deficit present.     Mental Status: He is alert and oriented to person, place, and time. Mental status is at baseline.     GCS: GCS eye subscore is 4. GCS verbal subscore is 5. GCS motor subscore is 6.     Cranial Nerves: Cranial nerves are intact. No cranial nerve deficit.     Sensory: No sensory deficit.     Motor: Motor function is intact.     Coordination: Coordination is intact.  Psychiatric:        Attention and Perception: Attention normal.        Speech: Speech normal.        Behavior: Behavior normal.    ED Results / Procedures / Treatments   Labs (all labs ordered are listed, but only abnormal results are displayed) Labs Reviewed  CBC WITH DIFFERENTIAL/PLATELET  BASIC METABOLIC PANEL    EKG None  Radiology No results found.  Procedures Procedures   Medications Ordered in ED Medications  metoCLOPramide (REGLAN) injection 5 mg (has no administration in time range)  cloNIDine (CATAPRES) tablet 0.1 mg (has no administration in time range)  0.9 %  sodium chloride infusion (has no administration in time range)  fentaNYL (SUBLIMAZE) injection 25 mcg (has no administration in time range)    ED Course  I have reviewed  the triage vital signs and the nursing notes.  Pertinent labs & imaging results that were available during my care of the patient were reviewed by me and considered in  my medical decision making (see chart for details).    MDM Rules/Calculators/A&P                          Head CT and blood work here are reassuring.  Headache treated here along with medication for blood pressure.  Blood pressure has improved.  We will follow-up with his doctor this week Final Clinical Impression(s) / ED Diagnoses Final diagnoses:  None    Rx / DC Orders ED Discharge Orders     None        Lorre Nick, MD 04/05/21 2043

## 2021-07-17 ENCOUNTER — Other Ambulatory Visit: Payer: Self-pay

## 2021-07-17 MED ORDER — ROSUVASTATIN CALCIUM 10 MG PO TABS: 10.0000 mg | ORAL_TABLET | Freq: Every day | ORAL | 0 refills | Status: DC

## 2021-08-20 ENCOUNTER — Encounter: Payer: Self-pay | Admitting: Cardiology

## 2021-08-28 ENCOUNTER — Encounter: Payer: Self-pay | Admitting: Cardiology

## 2021-08-28 ENCOUNTER — Ambulatory Visit: Payer: Federal, State, Local not specified - PPO | Admitting: Cardiology

## 2021-08-28 ENCOUNTER — Other Ambulatory Visit: Payer: Self-pay

## 2021-08-28 VITALS — BP 162/94 | HR 69 | Ht 68.0 in | Wt 167.4 lb

## 2021-08-28 DIAGNOSIS — I480 Paroxysmal atrial fibrillation: Secondary | ICD-10-CM

## 2021-08-28 DIAGNOSIS — E785 Hyperlipidemia, unspecified: Secondary | ICD-10-CM | POA: Diagnosis not present

## 2021-08-28 DIAGNOSIS — I700 Atherosclerosis of aorta: Secondary | ICD-10-CM

## 2021-08-28 DIAGNOSIS — R252 Cramp and spasm: Secondary | ICD-10-CM | POA: Insufficient documentation

## 2021-08-28 DIAGNOSIS — Z79899 Other long term (current) drug therapy: Secondary | ICD-10-CM

## 2021-08-28 DIAGNOSIS — I10 Essential (primary) hypertension: Secondary | ICD-10-CM | POA: Insufficient documentation

## 2021-08-28 DIAGNOSIS — I2510 Atherosclerotic heart disease of native coronary artery without angina pectoris: Secondary | ICD-10-CM

## 2021-08-28 LAB — LIPID PANEL
Chol/HDL Ratio: 2.6 ratio (ref 0.0–5.0)
Cholesterol, Total: 126 mg/dL (ref 100–199)
HDL: 48 mg/dL (ref >39)
LDL Chol Calc (NIH): 55 mg/dL (ref 0–99)
Triglycerides: 128 mg/dL (ref 0–149)
VLDL Cholesterol Cal: 23 mg/dL (ref 5–40)

## 2021-08-28 LAB — ALT: ALT: 18 IU/L (ref 0–44)

## 2021-08-28 NOTE — Patient Instructions (Signed)
Medication Instructions:  The current medical regimen is effective;  continue present plan and medications.  *If you need a refill on your cardiac medications before your next appointment, please call your pharmacy*  Lab Work: Please have blood work today (Lipid/ALT)  If you have labs (blood work) drawn today and your tests are completely normal, you will receive your results only by: MyChart Message (if you have MyChart) OR A paper copy in the mail If you have any lab test that is abnormal or we need to change your treatment, we will call you to review the results.  Follow-Up: At [facility], you and your health needs are our priority.  As part of our continuing mission to provide you with exceptional heart care, we have created designated Provider Care Teams.  These Care Teams include your primary Cardiologist (physician) and Advanced Practice Providers (APPs -  Physician Assistants and Nurse Practitioners) who all work together to provide you with the care you need, when you need it.  We recommend signing up for the patient portal called "MyChart".  Sign up information is provided on this After Visit Summary.  MyChart is used to connect with patients for Virtual Visits (Telemedicine).  Patients are able to view lab/test results, encounter notes, upcoming appointments, etc.  Non-urgent messages can be sent to your provider as well.   To learn more about what you can do with MyChart, go to [email or web address].    Your next appointment:   1 year(s)  The format for your next appointment:   In Person  Provider:   Mark Skains, MD     Thank you for choosing [facility]!!    

## 2021-08-28 NOTE — Progress Notes (Signed)
Cardiology Office Note:    Date:  08/28/2021   ID:  Hosey, Burmester Jul 10, 1941, MRN [id number]  PCP:  Marden Noble, MD   [facility]  Cardiologist:  Donato Schultz, MD  Advanced Practice Provider:  No care team member to display Electrophysiologist:  None       Referring MD: Marden Noble, MD     History of Present Illness:    CATHERINE OAK is a 80 y.o. male here for the follow-up of atrial fibrillation, and coronary artery disease.  He presented to the ED 04/05/2021 after noticing his BP spiked to 215/117 associated with a major headache. The next morning his blood pressure was 154/93 but the headache persisted.  At his last appointment his main concern was significant leg cramps.  He was wondering whether this is a vitamin deficiency or perhaps PAD. Taking his medications without difficulty.   Cardiac catheterization in 2006 resulted in mid LAD/diagonal disease with drug-eluting stent placement.   Paroxysmal atrial fibrillation post ablation in 2009 with no recurrence.   Enjoys riding his bike.    In 2020 we changed statin over to Crestor.  Today: Overall, he is feeling well aside from his blood pressure. Typically he may check his blood pressure before exercise and it reads 150/90. Then after completing 15 miles riding his bike his BP is 115/75. By the evening his blood pressure becomes elevated again.  Since his last visit his leg cramps have resolved with taking magnesium as needed, especially before heavy exercise. Normally he rides his bike  10-15 miles about 3 times a week.  He notes that just prior to visiting the ED in 03/2021 he had been using a chainsaw for hours removing several fallen trees from his yard.  His mother had hypertension and lived to 88 yo.  He denies any palpitations, chest pain, or shortness of breath. No lightheadedness, headaches, syncope, orthopnea, PND, lower extremity edema or exertional symptoms.  Past Medical  History:  Diagnosis Date   Coronary atherosclerosis of unspecified type of vessel, native or graft    coronary stent- Dr. Anne Fu follows LOV [phone] Epic   DJD (degenerative joint disease)    GERD (gastroesophageal reflux disease)    HTN (hypertension)    Hypercholesteremia    Paroxysmal atrial fibrillation (HCC) 09/07/2013   Ablation in 2009 at [facility], Dr. 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   clopidogrel (PLAVIX) 75 MG tablet TAKE 1 TABLET BY MOUTH EVERY DAY   co-enzyme Q-10 30 MG capsule Take 30 mg by mouth daily.   lansoprazole (PREVACID) 15 MG capsule Take 15 mg by mouth daily at 12 noon.   Magnesium Oxide (MAG-OXIDE) 200 MG TABS Take 1 tablet (200 mg total) by mouth daily.   metoprolol tartrate (LOPRESSOR) 25 MG tablet Take 1 tablet by mouth twice a day as needed for palpitations   Misc Natural Products (BETA-SITOSTEROL PLANT STEROLS PO) Take 325 mg by mouth daily.   rosuvastatin (CRESTOR) 10 MG tablet Take 1  tablet (10 mg total) by mouth daily.     Allergies:   Aspirin   Social History   Socioeconomic History   Marital status: Widowed    Spouse name: Not on file   Number of children: Not on file   Years of education: Not on file   Highest education level: Not on file  Occupational History   Not on file  Tobacco Use   Smoking status: Former    Types: Cigarettes    Quit date: 09/07/1957    Years since quitting: 64.0   Smokeless tobacco: Never  Vaping Use   Vaping Use: Never used  Substance and Sexual  Activity   Alcohol use: No   Drug use: No   Sexual activity: Not on file  Other Topics Concern   Not on file  Social History Narrative   Not on file   Social Determinants of Health   Financial Resource Strain: Not on file  Food Insecurity: Not on file  Transportation Needs: Not on file  Physical Activity: Not on file  Stress: Not on file  Social Connections: Not on file     Family History: The patient's family history includes Alcoholism in his father; Emphysema in his brother and father; Heart disease in his sister; Lung cancer in his sister; Osteoarthritis in his sister; Other in his mother; Pneumonia in his brother and father.  ROS:   Please see the history of present illness.    All other systems reviewed and are negative.  EKGs/Labs/Other Studies Reviewed:    LE Doppler 12/14/2020: Summary:  Right: Resting right ankle-brachial index is within normal range. No  evidence of significant right lower extremity arterial disease. The right  toe-brachial index is normal.   Left: Resting left ankle-brachial index is within normal range. No  evidence of significant left lower extremity arterial disease. The left  toe-brachial index is normal.   EKG:  EKG is personally reviewed and interpreted. 08/28/2021: EKG was not ordered.  Recent Labs: 04/05/2021: BUN 18; Creatinine, Ser 1.06; Hemoglobin 15.3; Platelets 198; Potassium 3.5; Sodium 140   Recent Lipid Panel    Component Value Date/Time   CHOL 115 08/04/2020 0834   TRIG 114 08/04/2020 0834   HDL 51 08/04/2020 0834   CHOLHDL 2.3 08/04/2020 0834   CHOLHDL 2.8 07/21/2019 1146   VLDL 20 07/21/2019 1146   LDLCALC 43 08/04/2020 0834    Risk Assessment/Calculations:      Physical Exam:    VS:  BP (!) 162/94   Pulse 69   Ht 5\' 8"  (1.727 m)   Wt 167 lb 6.4 oz (75.9 kg)   SpO2 99%   BMI 25.45 kg/m     Wt Readings from Last 3 Encounters:  08/28/21 167 lb 6.4 oz (75.9 kg)  04/05/21 160 lb (72.6 kg)  11/21/20 160  lb (72.6 kg)     GEN:  Well nourished, well developed in no acute distress HEENT: Normal NECK: No JVD; No carotid bruits LYMPHATICS: No lymphadenopathy CARDIAC: RRR, no murmurs, rubs, gallops RESPIRATORY:  Clear to auscultation without rales, wheezing or rhonchi  ABDOMEN: Soft, non-tender, non-distended MUSCULOSKELETAL:  No edema; No deformity  SKIN: Warm and dry NEUROLOGIC:  Alert and oriented x 3 PSYCHIATRIC:  Normal affect   ASSESSMENT:    1. Essential hypertension   2. Paroxysmal atrial fibrillation (HCC)   3. Hyperlipidemia, unspecified hyperlipidemia type   4. Medication management   5. Coronary artery disease involving native coronary artery of native heart without angina  pectoris   6. Aortic atherosclerosis (HCC)   7. Leg cramps     PLAN:    In order of problems listed above:  Coronary artery disease involving native coronary artery of native heart without angina pectoris Prior stent placement in 2006 diagonal/LAD.  Overall doing well.  No anginal symptoms currently.  Continuing with Plavix monotherapy.  Paroxysmal atrial fibrillation [facility]) Prior ablation 2009 at [facility], Dr. Ola Spurr.  Doing well with no return.  Aortic atherosclerosis (HCC) Continue with Plavix hypertension control statin use.  Essential hypertension Has been challenging to control recently.  After a bike ride he is usually normotensive.  It can creep up throughout the day.  Dr. Inda Merlin has seen him recently and started amlodipine valsartan.  Continue to titrate per Dr. Inda Merlin.  Excellent.  Leg cramps Lower extremity Dopplers were normal.  Magnesium supplementation he takes periodically especially if he has heavy exertion.  Overall doing well.   Follow-up: 1 year.  Medication Adjustments/Labs and Tests Ordered: Current medicines are reviewed at length with the patient today.  Concerns regarding medicines are outlined above.   Orders Placed This Encounter  Procedures   ALT   Lipid panel    EKG 12-Lead    No orders of the defined types were placed in this encounter.  Patient Instructions  Medication Instructions:  The current medical regimen is effective;  continue present plan and medications.  *If you need a refill on your cardiac medications before your next appointment, please call your pharmacy*  Lab Work: Please have blood work today (Lipid/ALT)  If you have labs (blood work) drawn today and your tests are completely normal, you will receive your results only by: MyChart Message (if you have MyChart) OR A paper copy in the mail If you have any lab test that is abnormal or we need to change your treatment, we will call you to review the results.  Follow-Up: At [facility], you and your health needs are our priority.  As part of our continuing mission to provide you with exceptional heart care, we have created designated Provider Care Teams.  These Care Teams include your primary Cardiologist (physician) and Advanced Practice Providers (APPs -  Physician Assistants and Nurse Practitioners) who all work together to provide you with the care you need, when you need it.  We recommend signing up for the patient portal called "MyChart".  Sign up information is provided on this After Visit Summary.  MyChart is used to connect with patients for Virtual Visits (Telemedicine).  Patients are able to view lab/test results, encounter notes, upcoming appointments, etc.  Non-urgent messages can be sent to your provider as well.   To learn more about what you can do with MyChart, go to [email or web address].    Your next appointment:   1 year(s)  The format for your next appointment:   In Person  Provider:   Candee Furbish, MD     Thank you for choosing [facility]!!     I,Mathew Stumpf,acting as a scribe for Candee Furbish, MD.,have documented all relevant documentation on the behalf of Candee Furbish, MD,as directed by  Candee Furbish, MD while in the presence of  Candee Furbish, MD.  I, Candee Furbish, MD, have reviewed all documentation for this visit. The documentation on 08/28/21 for the exam, diagnosis, procedures, and orders are all accurate and complete.   Signed, Candee Furbish, MD  08/28/2021 9:24 AM    [facility]

## 2021-08-28 NOTE — Assessment & Plan Note (Addendum)
Prior ablation 2009 at [facility], Dr. Sampson Goon.  Doing well with no return.

## 2021-08-28 NOTE — Assessment & Plan Note (Signed)
Continue with Plavix hypertension control statin use.

## 2021-08-28 NOTE — Assessment & Plan Note (Signed)
Lower extremity Dopplers were normal.  Magnesium supplementation he takes periodically especially if he has heavy exertion.  Overall doing well.

## 2021-08-28 NOTE — Assessment & Plan Note (Signed)
Has been challenging to control recently.  After a bike ride he is usually normotensive.  It can creep up throughout the day.  Dr. Kevan Ny has seen him recently and started amlodipine valsartan.  Continue to titrate per Dr. Kevan Ny.  Excellent.

## 2021-08-28 NOTE — Assessment & Plan Note (Signed)
Prior stent placement in 2006 diagonal/LAD.  Overall doing well.  No anginal symptoms currently.  Continuing with Plavix monotherapy.

## 2021-08-30 ENCOUNTER — Encounter: Payer: Self-pay | Admitting: *Deleted

## 2021-09-05 ENCOUNTER — Other Ambulatory Visit: Payer: Self-pay | Admitting: Cardiology

## 2021-10-13 ENCOUNTER — Other Ambulatory Visit: Payer: Self-pay | Admitting: *Deleted

## 2021-10-13 MED ORDER — ROSUVASTATIN CALCIUM 10 MG PO TABS: 10.0000 mg | ORAL_TABLET | Freq: Every day | ORAL | 3 refills | Status: DC

## 2022-08-07 ENCOUNTER — Encounter: Payer: Self-pay | Admitting: Cardiology

## 2022-08-07 DIAGNOSIS — Z79899 Other long term (current) drug therapy: Secondary | ICD-10-CM

## 2022-08-07 DIAGNOSIS — I480 Paroxysmal atrial fibrillation: Secondary | ICD-10-CM

## 2022-08-07 DIAGNOSIS — E785 Hyperlipidemia, unspecified: Secondary | ICD-10-CM

## 2022-08-07 DIAGNOSIS — I10 Essential (primary) hypertension: Secondary | ICD-10-CM

## 2022-08-21 ENCOUNTER — Ambulatory Visit: Payer: Federal, State, Local not specified - PPO | Attending: Cardiology

## 2022-08-21 DIAGNOSIS — E785 Hyperlipidemia, unspecified: Secondary | ICD-10-CM

## 2022-08-21 DIAGNOSIS — I10 Essential (primary) hypertension: Secondary | ICD-10-CM

## 2022-08-21 DIAGNOSIS — Z79899 Other long term (current) drug therapy: Secondary | ICD-10-CM

## 2022-08-21 DIAGNOSIS — I480 Paroxysmal atrial fibrillation: Secondary | ICD-10-CM

## 2022-08-21 LAB — LIPID PANEL
Chol/HDL Ratio: 3 ratio (ref 0.0–5.0)
Cholesterol, Total: 132 mg/dL (ref 100–199)
HDL: 44 mg/dL (ref >39)
LDL Chol Calc (NIH): 67 mg/dL (ref 0–99)
Triglycerides: 116 mg/dL (ref 0–149)
VLDL Cholesterol Cal: 21 mg/dL (ref 5–40)

## 2022-08-21 LAB — CBC
Hematocrit: 42 % (ref 37.5–51.0)
Hemoglobin: 15 g/dL (ref 13.0–17.7)
MCH: 30.7 pg (ref 26.6–33.0)
MCHC: 35.7 g/dL (ref 31.5–35.7)
MCV: 86 fL (ref 79–97)
Platelets: 202 10*3/uL (ref 150–450)
RBC: 4.89 x10E6/uL (ref 4.14–5.80)
RDW: 11.9 % (ref 11.6–15.4)
WBC: 6.1 10*3/uL (ref 3.4–10.8)

## 2022-08-21 LAB — COMPREHENSIVE METABOLIC PANEL
ALT: 15 IU/L (ref 0–44)
AST: 16 IU/L (ref 0–40)
Albumin/Globulin Ratio: 2.7 — ABNORMAL HIGH (ref 1.2–2.2)
Albumin: 4.8 g/dL (ref 3.8–4.8)
Alkaline Phosphatase: 58 IU/L (ref 44–121)
BUN/Creatinine Ratio: 14 (ref 10–24)
BUN: 16 mg/dL (ref 8–27)
Bilirubin Total: 1.8 mg/dL — ABNORMAL HIGH (ref 0.0–1.2)
CO2: 24 mmol/L (ref 20–29)
Calcium: 9.2 mg/dL (ref 8.6–10.2)
Chloride: 106 mmol/L (ref 96–106)
Creatinine, Ser: 1.13 mg/dL (ref 0.76–1.27)
Globulin, Total: 1.8 g/dL (ref 1.5–4.5)
Glucose: 99 mg/dL (ref 70–99)
Potassium: 4.4 mmol/L (ref 3.5–5.2)
Sodium: 142 mmol/L (ref 134–144)
Total Protein: 6.6 g/dL (ref 6.0–8.5)
eGFR: 66 mL/min/{1.73_m2} (ref >59)

## 2022-08-25 ENCOUNTER — Other Ambulatory Visit: Payer: Self-pay | Admitting: Cardiology

## 2022-08-28 ENCOUNTER — Ambulatory Visit: Payer: Federal, State, Local not specified - PPO | Attending: Cardiology | Admitting: Cardiology

## 2022-08-28 ENCOUNTER — Encounter: Payer: Self-pay | Admitting: Cardiology

## 2022-08-28 VITALS — BP 140/80 | HR 64 | Ht 68.0 in | Wt 166.0 lb

## 2022-08-28 DIAGNOSIS — I480 Paroxysmal atrial fibrillation: Secondary | ICD-10-CM

## 2022-08-28 DIAGNOSIS — I2510 Atherosclerotic heart disease of native coronary artery without angina pectoris: Secondary | ICD-10-CM

## 2022-08-28 NOTE — Progress Notes (Signed)
Cardiology Office Note:    Date:  08/28/2022   ID:  Gonzalez, Willie [birth year], MRN [id number]  PCP:  Willie Noble, MD   [facility]  Cardiologist:  Willie Schultz, MD  Advanced Practice Provider:  No care team member to display Electrophysiologist:  None       Referring MD: Willie Noble, MD     History of Present Illness:    Willie Gonzalez is a 81 y.o. male here for the follow-up of atrial fibrillation post ablation at [facility], and coronary artery disease.  He presented to the ED 04/05/2021 after noticing his BP spiked to 215/117 associated with a major headache. The next morning his blood pressure was 154/93 but the headache persisted.  He was having some issues with leg cramps at a prior visit and had some concerned that it may be related to PAD. He reported that he was tolerating his medications without any ill effects.   Cardiac catheterization in 2006 resulted in mid LAD/diagonal disease with drug-eluting stent placement.   Paroxysmal atrial fibrillation post ablation in 2009 with no recurrence.   Enjoys riding his bike.    In 2020 we changed statin over to Crestor.  At his last appointment he reported he was feeling well overall. His blood pressure before exercise was around 150/90, and after a 15 mile bike it would be 115/75, and would rise to an elevated level by nighttime. His leg cramps had resolved with the addition of the magnesium supplement. He was still riding his bike about 10-15 miles 3 times a week. He noted that just prior to his ED visit in 03/2021 he had been using a chainsaw for hours removing fallen trees. He reported that his mother had a history of hypertension and lived to 80 years of age.   Today, he reports that he is doing well. He is still cycling regularly. He had a stent placed in 2006 and has done well with it since then. He was interested in reducing or stopping the statin if possible. He has been taking it every other  day but he will go back to doing it daily since his LDL number is 67 mg/dL and we don't want it to increase over 70 mg/dL.   He denies any palpitations, chest pain, shortness of breath, or peripheral edema. No lightheadedness, headaches, syncope, orthopnea, or PND.    Past Medical History:  Diagnosis Date   Coronary atherosclerosis of unspecified type of vessel, native or graft    coronary stent- Dr. Anne Gonzalez follows LOV [phone] Epic   DJD (degenerative joint disease)    GERD (gastroesophageal reflux disease)    HTN (hypertension)    Hypercholesteremia    Paroxysmal atrial fibrillation (HCC) 09/07/2013   Ablation in 2009 at [facility], Dr. 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   clopidogrel (PLAVIX) 75 MG tablet TAKE 1 TABLET BY MOUTH EVERY DAY   co-enzyme Q-10 30 MG capsule Take 30 mg by mouth daily.   lansoprazole (PREVACID)  15 MG capsule Take 15 mg by mouth daily at 12 noon.   Magnesium Oxide (MAG-OXIDE) 200 MG TABS Take 1 tablet (200 mg total) by mouth daily.   Misc Natural Products (BETA-SITOSTEROL PLANT STEROLS PO) Take 325 mg by mouth daily.   rosuvastatin (CRESTOR) 10 MG tablet Take 1 tablet (10 mg total) by mouth daily.   [DISCONTINUED] metoprolol tartrate (LOPRESSOR) 25 MG tablet Take 1 tablet by mouth twice a day as needed for palpitations     Allergies:   Aspirin   Social History   Socioeconomic History   Marital status: Widowed    Spouse name: Not on file   Number of children: Not on file   Years of education:  Not on file   Highest education level: Not on file  Occupational History   Not on file  Tobacco Use   Smoking status: Former    Types: Cigarettes    Quit date: 09/07/1957    Years since quitting: 65.0   Smokeless tobacco: Never  Vaping Use   Vaping Use: Never used  Substance and Sexual Activity   Alcohol use: No   Drug use: No   Sexual activity: Not on file  Other Topics Concern   Not on file  Social History Narrative   Not on file   Social Determinants of Health   Financial Resource Strain: Not on file  Food Insecurity: Not on file  Transportation Needs: Not on file  Physical Activity: Not on file  Stress: Not on file  Social Connections: Not on file     Family History: The patient's family history includes Alcoholism in his father; Emphysema in his brother and father; Heart disease in his sister; Lung cancer in his sister; Osteoarthritis in his sister; Other in his mother; Pneumonia in his brother and father.  ROS:   Please see the history of present illness.     All other systems reviewed and are negative.  EKGs/Labs/Other Studies Reviewed:    LE Doppler 12/14/2020: Summary:  Right: Resting right ankle-brachial index is within normal range. No  evidence of significant right lower extremity arterial disease. The right  toe-brachial index is normal.   Left: Resting left ankle-brachial index is within normal range. No  evidence of significant left lower extremity arterial disease. The left  toe-brachial index is normal.   EKG:  EKG is personally reviewed.  08/28/2022: Normal Sinus Rhythm. Heart rate 64 bpm.  08/28/2021: EKG was not ordered.  Recent Labs: 08/21/2022: ALT 15; BUN 16; Creatinine, Ser 1.13; Hemoglobin 15.0; Platelets 202; Potassium 4.4; Sodium 142   Recent Lipid Panel    Component Value Date/Time   CHOL 132 08/21/2022 0819   TRIG 116 08/21/2022 0819   HDL 44 08/21/2022 0819   CHOLHDL 3.0 08/21/2022 0819   CHOLHDL 2.8 07/21/2019 1146   VLDL  20 07/21/2019 1146   LDLCALC 67 08/21/2022 0819    Risk Assessment/Calculations:      Physical Exam:    VS:  BP (!) 140/80 (BP Location: Left Arm, Patient Position: Sitting, Cuff Size: Normal)   Pulse 64   Ht 5\' 8"  (1.727 m)   Wt 166 lb (75.3 kg)   BMI 25.24 kg/m     Wt Readings from Last 3 Encounters:  08/28/22 166 lb (75.3 kg)  08/28/21 167 lb 6.4 oz (75.9 kg)  04/05/21 160 lb (72.6 kg)     GEN:  Well nourished, well developed in no acute distress HEENT: Normal NECK: No JVD; No carotid bruits  LYMPHATICS: No lymphadenopathy CARDIAC: RRR, no murmurs, rubs, gallops RESPIRATORY:  Clear to auscultation without rales, wheezing or rhonchi  ABDOMEN: Soft, non-tender, non-distended MUSCULOSKELETAL:  No edema; No deformity  SKIN: Warm and dry NEUROLOGIC:  Alert and oriented x 3 PSYCHIATRIC:  Normal affect   ASSESSMENT:    1. Paroxysmal atrial fibrillation (HCC)   2. Coronary artery disease involving native coronary artery of native heart without angina pectoris      PLAN:    In order of problems listed above:  Coronary artery disease involving native coronary artery of native heart without angina pectoris Prior stent placement in 2006 diagonal/LAD.  Overall doing well.  No anginal symptoms currently.  Continuing with Plavix monotherapy.  No changes made.  Continuing to cycle.  Excellent.  Lets go ahead and continue with his Crestor 10 mg.  His LDL is less than 70.  67 currently.   Paroxysmal atrial fibrillation [facility]) Prior ablation 2009 at [facility], Dr. Sampson Goon.  Doing well with no return.   Aortic atherosclerosis (HCC) Continue with Plavix hypertension control statin use.  Wonderful.  Doing well.   Essential hypertension Overall well controlled currently.  Prior to the cycle it is usually higher than after biking.  Continue with current medication management.   Leg cramps Lower extremity Dopplers were normal.  Magnesium supplementation he takes periodically  especially if he has heavy exertion.  Overall doing well.  No complaints about this today.  Follow-up: 1 year  Medication Adjustments/Labs and Tests Ordered: Current medicines are reviewed at length with the patient today.  Concerns regarding medicines are outlined above.   Orders Placed This Encounter  Procedures   EKG 12-Lead    No orders of the defined types were placed in this encounter.   Patient Instructions  Medication Instructions:  Your physician recommends that you continue on your current medications as directed. Please refer to the Current Medication list given to you today.  *If you need a refill on your cardiac medications before your next appointment, please call your pharmacy*   Lab Work: None. If you have labs (blood work) drawn today and your tests are completely normal, you will receive your results only by: MyChart Message (if you have MyChart) OR A paper copy in the mail If you have any lab test that is abnormal or we need to change your treatment, we will call you to review the results.   Testing/Procedures: None.   Follow-Up: At [facility], you and your health needs are our priority.  As part of our continuing mission to provide you with exceptional heart care, we have created designated Provider Care Teams.  These Care Teams include your primary Cardiologist (physician) and Advanced Practice Providers (APPs -  Physician Assistants and Nurse Practitioners) who all work together to provide you with the care you need, when you need it.  We recommend signing up for the patient portal called "MyChart".  Sign up information is provided on this After Visit Summary.  MyChart is used to connect with patients for Virtual Visits (Telemedicine).  Patients are able to view lab/test results, encounter notes, upcoming appointments, etc.  Non-urgent messages can be sent to your provider as well.   To learn more about what you can do with MyChart, go to  [email or web address].    Your next appointment:   1 year(s)  The format for your next appointment:   In Person  Provider:   Donato Schultz, MD      Important Information About Sugar  I,Jessica Ford,acting as a Neurosurgeon for [employer], MD.,have documented all relevant documentation on the behalf of Willie Schultz, MD,as directed by  Willie Schultz, MD while in the presence of Willie Schultz, MD.   I, Willie Schultz, MD, have reviewed all documentation for this visit. The documentation on 08/28/22 for the exam, diagnosis, procedures, and orders are all accurate and complete.   Signed, Willie Schultz, MD  08/28/2022 5:30 PM    [facility]

## 2022-08-28 NOTE — Patient Instructions (Signed)
Medication Instructions:  Your physician recommends that you continue on your current medications as directed. Please refer to the Current Medication list given to you today.  *If you need a refill on your cardiac medications before your next appointment, please call your pharmacy*   Lab Work: None. If you have labs (blood work) drawn today and your tests are completely normal, you will receive your results only by: MyChart Message (if you have MyChart) OR A paper copy in the mail If you have any lab test that is abnormal or we need to change your treatment, we will call you to review the results.   Testing/Procedures: None.   Follow-Up: At [facility], you and your health needs are our priority.  As part of our continuing mission to provide you with exceptional heart care, we have created designated Provider Care Teams.  These Care Teams include your primary Cardiologist (physician) and Advanced Practice Providers (APPs -  Physician Assistants and Nurse Practitioners) who all work together to provide you with the care you need, when you need it.  We recommend signing up for the patient portal called "MyChart".  Sign up information is provided on this After Visit Summary.  MyChart is used to connect with patients for Virtual Visits (Telemedicine).  Patients are able to view lab/test results, encounter notes, upcoming appointments, etc.  Non-urgent messages can be sent to your provider as well.   To learn more about what you can do with MyChart, go to [email or web address].    Your next appointment:   1 year(s)  The format for your next appointment:   In Person  Provider:   Mark Skains, MD      Important Information About Sugar       

## 2022-10-08 ENCOUNTER — Other Ambulatory Visit: Payer: Self-pay

## 2022-10-08 ENCOUNTER — Other Ambulatory Visit: Payer: Self-pay | Admitting: *Deleted

## 2022-10-08 MED ORDER — ROSUVASTATIN CALCIUM 10 MG PO TABS: 10.0000 mg | ORAL_TABLET | Freq: Every day | ORAL | 3 refills | Status: DC

## 2022-10-09 ENCOUNTER — Encounter: Payer: Self-pay | Admitting: Cardiology

## 2022-10-10 MED ORDER — METOPROLOL TARTRATE 25 MG PO TABS: 25.0000 mg | ORAL_TABLET | Freq: Every day | ORAL | 3 refills | Status: AC | PRN

## 2022-10-18 ENCOUNTER — Encounter: Payer: Self-pay | Admitting: Cardiology

## 2023-06-06 ENCOUNTER — Telehealth: Payer: Self-pay | Admitting: Cardiology

## 2023-06-06 DIAGNOSIS — Z79899 Other long term (current) drug therapy: Secondary | ICD-10-CM

## 2023-06-06 DIAGNOSIS — E785 Hyperlipidemia, unspecified: Secondary | ICD-10-CM

## 2023-06-06 DIAGNOSIS — I2510 Atherosclerotic heart disease of native coronary artery without angina pectoris: Secondary | ICD-10-CM

## 2023-06-06 DIAGNOSIS — I700 Atherosclerosis of aorta: Secondary | ICD-10-CM

## 2023-06-06 NOTE — Telephone Encounter (Signed)
Returned a call back to the pt.   Pt see's Dr. Anne Fu on a yearly basis and gets his labs done prior that visit every year.   Dr. Anne Fu orders lipids, cmet, and cbc on the pt yearly.  Will place those orders in the system.  Pt is scheduled to see Dr. Anne Fu for his yearly on 10/14/23.  Scheduled his labs the Friday before his follow-up appt with Dr. Anne Fu, on 10/11/23.  He is aware to come fasting to that lab appt.   Pt verbalized understanding and agrees with this plan.  Pt was more than gracious for all the assistance provided.

## 2023-06-06 NOTE — Telephone Encounter (Signed)
Received a message via patient schedule from patient requesting to schedule his one year follow up as well as routine labs. His appointment with Dr. Anne Fu has been scheduled for 01/13, but there is not active orders in the system to schedule labs.   Please advise.

## 2023-08-17 ENCOUNTER — Other Ambulatory Visit: Payer: Self-pay | Admitting: Cardiology

## 2023-10-01 ENCOUNTER — Other Ambulatory Visit (HOSPITAL_COMMUNITY): Payer: Self-pay

## 2023-10-01 ENCOUNTER — Other Ambulatory Visit: Payer: Self-pay

## 2023-10-01 MED ORDER — ROSUVASTATIN CALCIUM 10 MG PO TABS
10.0000 mg | ORAL_TABLET | Freq: Every day | ORAL | 0 refills | Status: DC
  Filled 2023-10-01: qty 30, 30d supply, fill #0

## 2023-10-03 ENCOUNTER — Other Ambulatory Visit (HOSPITAL_COMMUNITY): Payer: Self-pay

## 2023-10-03 ENCOUNTER — Other Ambulatory Visit: Payer: Self-pay

## 2023-10-03 MED ORDER — ROSUVASTATIN CALCIUM 10 MG PO TABS: 10.0000 mg | ORAL_TABLET | Freq: Every day | ORAL | 0 refills | Status: DC

## 2023-10-11 ENCOUNTER — Other Ambulatory Visit: Payer: Federal, State, Local not specified - PPO

## 2023-10-11 DIAGNOSIS — E785 Hyperlipidemia, unspecified: Secondary | ICD-10-CM

## 2023-10-11 DIAGNOSIS — I2510 Atherosclerotic heart disease of native coronary artery without angina pectoris: Secondary | ICD-10-CM

## 2023-10-11 DIAGNOSIS — Z79899 Other long term (current) drug therapy: Secondary | ICD-10-CM

## 2023-10-11 DIAGNOSIS — I700 Atherosclerosis of aorta: Secondary | ICD-10-CM

## 2023-10-11 LAB — LIPID PANEL

## 2023-10-12 LAB — COMPREHENSIVE METABOLIC PANEL
ALT: 13 IU/L (ref 0–44)
AST: 20 IU/L (ref 0–40)
Albumin: 4.8 g/dL — ABNORMAL HIGH (ref 3.7–4.7)
Alkaline Phosphatase: 61 IU/L (ref 44–121)
BUN/Creatinine Ratio: 12 (ref 10–24)
BUN: 13 mg/dL (ref 8–27)
Bilirubin Total: 2.1 mg/dL — ABNORMAL HIGH (ref 0.0–1.2)
CO2: 23 mmol/L (ref 20–29)
Calcium: 9.8 mg/dL (ref 8.6–10.2)
Chloride: 105 mmol/L (ref 96–106)
Creatinine, Ser: 1.11 mg/dL (ref 0.76–1.27)
Globulin, Total: 2.3 g/dL (ref 1.5–4.5)
Glucose: 102 mg/dL — ABNORMAL HIGH (ref 70–99)
Potassium: 4.4 mmol/L (ref 3.5–5.2)
Sodium: 142 mmol/L (ref 134–144)
Total Protein: 7.1 g/dL (ref 6.0–8.5)
eGFR: 67 mL/min/{1.73_m2} (ref >59)

## 2023-10-12 LAB — CBC
Hematocrit: 46.3 % (ref 37.5–51.0)
Hemoglobin: 15.8 g/dL (ref 13.0–17.7)
MCH: 30.7 pg (ref 26.6–33.0)
MCHC: 34.1 g/dL (ref 31.5–35.7)
MCV: 90 fL (ref 79–97)
Platelets: 221 10*3/uL (ref 150–450)
RBC: 5.15 x10E6/uL (ref 4.14–5.80)
RDW: 12.4 % (ref 11.6–15.4)
WBC: 5.6 10*3/uL (ref 3.4–10.8)

## 2023-10-12 LAB — LIPID PANEL
Cholesterol, Total: 130 mg/dL (ref 100–199)
HDL: 48 mg/dL (ref >39)
LDL CALC COMMENT:: 2.7 ratio (ref 0.0–5.0)
LDL Chol Calc (NIH): 60 mg/dL (ref 0–99)
Triglycerides: 123 mg/dL (ref 0–149)
VLDL Cholesterol Cal: 22 mg/dL (ref 5–40)

## 2023-10-14 ENCOUNTER — Ambulatory Visit: Payer: Federal, State, Local not specified - PPO | Attending: Cardiology | Admitting: Cardiology

## 2023-10-14 VITALS — BP 140/58 | HR 64 | Ht 68.0 in | Wt 160.0 lb

## 2023-10-14 DIAGNOSIS — I700 Atherosclerosis of aorta: Secondary | ICD-10-CM | POA: Diagnosis not present

## 2023-10-14 DIAGNOSIS — I480 Paroxysmal atrial fibrillation: Secondary | ICD-10-CM

## 2023-10-14 DIAGNOSIS — E785 Hyperlipidemia, unspecified: Secondary | ICD-10-CM | POA: Diagnosis not present

## 2023-10-14 DIAGNOSIS — I2510 Atherosclerotic heart disease of native coronary artery without angina pectoris: Secondary | ICD-10-CM | POA: Diagnosis not present

## 2023-10-14 DIAGNOSIS — I10 Essential (primary) hypertension: Secondary | ICD-10-CM

## 2023-10-14 NOTE — Patient Instructions (Signed)
 Medication Instructions:  The current medical regimen is effective;  continue present plan and medications.  *If you need a refill on your cardiac medications before your next appointment, please call your pharmacy*  Follow-Up: At [facility], you and your health needs are our priority.  As part of our continuing mission to provide you with exceptional heart care, we have created designated Provider Care Teams.  These Care Teams include your primary Cardiologist (physician) and Advanced Practice Providers (APPs -  Physician Assistants and Nurse Practitioners) who all work together to provide you with the care you need, when you need it.  We recommend signing up for the patient portal called "MyChart".  Sign up information is provided on this After Visit Summary.  MyChart is used to connect with patients for Virtual Visits (Telemedicine).  Patients are able to view lab/test results, encounter notes, upcoming appointments, etc.  Non-urgent messages can be sent to your provider as well.   To learn more about what you can do with MyChart, go to [email or web address].    Your next appointment:   2 year(s)  Provider:   Donato Schultz, MD
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
## 2023-11-16 ENCOUNTER — Other Ambulatory Visit: Payer: Self-pay | Admitting: Cardiology

## 2024-06-09 ENCOUNTER — Other Ambulatory Visit: Payer: Self-pay

## 2024-06-10 MED ORDER — ROSUVASTATIN CALCIUM 10 MG PO TABS: 10.0000 mg | ORAL_TABLET | Freq: Every day | ORAL | 3 refills | Status: AC

## 2024-07-28 ENCOUNTER — Encounter: Payer: Self-pay | Admitting: Cardiology
